# Patient Record
Sex: Male | Born: 1955 | Race: White | Hispanic: No | Marital: Married | Smoking: Former smoker
Health system: Southern US, Community
[De-identification: ages and names within clinical notes are randomized; demographics above are authoritative.]

## PROBLEM LIST (undated history)

## (undated) DIAGNOSIS — G3109 Other frontotemporal dementia: Secondary | ICD-10-CM

## (undated) DIAGNOSIS — F329 Major depressive disorder, single episode, unspecified: Secondary | ICD-10-CM

## (undated) DIAGNOSIS — F0280 Dementia in other diseases classified elsewhere without behavioral disturbance: Secondary | ICD-10-CM

## (undated) DIAGNOSIS — R413 Other amnesia: Secondary | ICD-10-CM

## (undated) DIAGNOSIS — I10 Essential (primary) hypertension: Secondary | ICD-10-CM

## (undated) DIAGNOSIS — G1220 Motor neuron disease, unspecified: Secondary | ICD-10-CM

## (undated) DIAGNOSIS — F32A Depression, unspecified: Secondary | ICD-10-CM

## (undated) DIAGNOSIS — R296 Repeated falls: Secondary | ICD-10-CM

## (undated) DIAGNOSIS — F419 Anxiety disorder, unspecified: Secondary | ICD-10-CM

## (undated) HISTORY — DX: Other amnesia: R41.3

## (undated) HISTORY — DX: Depression, unspecified: F32.A

## (undated) HISTORY — DX: Major depressive disorder, single episode, unspecified: F32.9

## (undated) HISTORY — DX: Repeated falls: R29.6

## (undated) HISTORY — DX: Essential (primary) hypertension: I10

## (undated) HISTORY — DX: Anxiety disorder, unspecified: F41.9

---

## 2014-04-10 ENCOUNTER — Ambulatory Visit: Payer: Self-pay | Admitting: Internal Medicine

## 2014-07-08 DIAGNOSIS — F1010 Alcohol abuse, uncomplicated: Secondary | ICD-10-CM | POA: Insufficient documentation

## 2015-05-01 ENCOUNTER — Other Ambulatory Visit: Payer: Self-pay | Admitting: Physician Assistant

## 2015-05-01 DIAGNOSIS — R269 Unspecified abnormalities of gait and mobility: Secondary | ICD-10-CM

## 2015-05-01 DIAGNOSIS — R413 Other amnesia: Secondary | ICD-10-CM

## 2015-05-07 ENCOUNTER — Ambulatory Visit: Payer: 59

## 2015-05-12 ENCOUNTER — Ambulatory Visit
Admission: RE | Admit: 2015-05-12 | Discharge: 2015-05-12 | Disposition: A | Discharge status: Home / Self Care | Payer: 59 | Source: Ambulatory Visit | Attending: Physician Assistant | Admitting: Physician Assistant

## 2015-05-12 DIAGNOSIS — R413 Other amnesia: Secondary | ICD-10-CM

## 2015-05-12 DIAGNOSIS — R269 Unspecified abnormalities of gait and mobility: Secondary | ICD-10-CM | POA: Insufficient documentation

## 2015-05-12 MED ORDER — GADOBENATE DIMEGLUMINE 529 MG/ML IV SOLN
20.0000 mL | Freq: Once | INTRAVENOUS | Status: AC | PRN
  Administered 2015-05-12: 17 mL via INTRAVENOUS

## 2015-11-18 ENCOUNTER — Encounter: Payer: Self-pay | Admitting: Psychiatry

## 2015-11-18 ENCOUNTER — Ambulatory Visit (INDEPENDENT_AMBULATORY_CARE_PROVIDER_SITE_OTHER): Payer: 59 | Admitting: Psychiatry

## 2015-11-18 VITALS — BP 122/86 | HR 71 | Temp 97.2°F | Ht 68.0 in | Wt 188.4 lb

## 2015-11-18 DIAGNOSIS — F331 Major depressive disorder, recurrent, moderate: Secondary | ICD-10-CM

## 2015-11-18 DIAGNOSIS — F10280 Alcohol dependence with alcohol-induced anxiety disorder: Secondary | ICD-10-CM | POA: Diagnosis not present

## 2015-11-18 MED ORDER — AMANTADINE HCL 100 MG PO TABS: 100.0000 mg | ORAL_TABLET | Freq: Every day | ORAL | Status: DC

## 2015-11-18 MED ORDER — PROPRANOLOL HCL ER 80 MG PO CP24: 80.0000 mg | ORAL_CAPSULE | Freq: Every day | ORAL | Status: DC
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
## 2015-12-19 ENCOUNTER — Other Ambulatory Visit: Payer: Self-pay

## 2015-12-19 ENCOUNTER — Ambulatory Visit (INDEPENDENT_AMBULATORY_CARE_PROVIDER_SITE_OTHER): Payer: 59 | Admitting: Psychiatry

## 2015-12-19 ENCOUNTER — Ambulatory Visit: Payer: 59 | Admitting: Psychiatry

## 2015-12-19 ENCOUNTER — Encounter: Payer: Self-pay | Admitting: Psychiatry

## 2015-12-19 VITALS — BP 124/82 | HR 69 | Temp 97.0°F | Ht 66.0 in | Wt 184.4 lb

## 2015-12-19 DIAGNOSIS — F331 Major depressive disorder, recurrent, moderate: Secondary | ICD-10-CM | POA: Diagnosis not present

## 2015-12-19 DIAGNOSIS — F10280 Alcohol dependence with alcohol-induced anxiety disorder: Secondary | ICD-10-CM

## 2015-12-19 MED ORDER — SERTRALINE HCL 100 MG PO TABS: 100.0000 mg | ORAL_TABLET | Freq: Every day | ORAL | Status: DC

## 2015-12-19 MED ORDER — BUSPIRONE HCL 15 MG PO TABS: 15.0000 mg | ORAL_TABLET | Freq: Two times a day (BID) | ORAL | Status: DC

## 2015-12-19 MED ORDER — PROPRANOLOL HCL ER 60 MG PO CP24: 60.0000 mg | ORAL_CAPSULE | Freq: Every day | ORAL | Status: DC

## 2015-12-19 MED ORDER — AMANTADINE HCL 100 MG PO TABS: 100.0000 mg | ORAL_TABLET | Freq: Two times a day (BID) | ORAL | Status: DC

## 2015-12-19 MED ORDER — VITAMIN B-1 100 MG PO TABS: 100.0000 mg | ORAL_TABLET | Freq: Every day | ORAL | Status: DC
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
## 2015-12-19 NOTE — Telephone Encounter (Signed)
received a request that patient insurance will only cover for a 90 day supply please resend new rx for 90 days

## 2015-12-22 ENCOUNTER — Telehealth: Payer: Self-pay

## 2015-12-22 MED ORDER — PROPRANOLOL HCL ER 60 MG PO CP24: 60.0000 mg | ORAL_CAPSULE | Freq: Every day | ORAL | Status: DC

## 2015-12-22 MED ORDER — BUSPIRONE HCL 15 MG PO TABS: 15.0000 mg | ORAL_TABLET | Freq: Two times a day (BID) | ORAL | Status: DC

## 2015-12-22 MED ORDER — SERTRALINE HCL 100 MG PO TABS: 100.0000 mg | ORAL_TABLET | Freq: Every day | ORAL | Status: DC

## 2015-12-22 NOTE — Telephone Encounter (Signed)
left message with pharmacy that direction are suppose to be take 1/2 tablet bid #[phone]

## 2016-01-16 ENCOUNTER — Encounter: Payer: Self-pay | Admitting: Psychiatry

## 2016-01-16 ENCOUNTER — Ambulatory Visit (INDEPENDENT_AMBULATORY_CARE_PROVIDER_SITE_OTHER): Payer: 59 | Admitting: Psychiatry

## 2016-01-16 VITALS — BP 122/88 | HR 69 | Temp 98.1°F | Ht 66.0 in | Wt 186.0 lb

## 2016-01-16 DIAGNOSIS — F331 Major depressive disorder, recurrent, moderate: Secondary | ICD-10-CM | POA: Diagnosis not present

## 2016-01-16 DIAGNOSIS — F10280 Alcohol dependence with alcohol-induced anxiety disorder: Secondary | ICD-10-CM

## 2016-01-16 MED ORDER — SERTRALINE HCL 100 MG PO TABS: 100.0000 mg | ORAL_TABLET | Freq: Every day | ORAL | Status: DC

## 2016-01-16 MED ORDER — PROPRANOLOL HCL 40 MG PO TABS: 40.0000 mg | ORAL_TABLET | Freq: Every morning | ORAL | Status: DC
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
## 2016-01-22 ENCOUNTER — Other Ambulatory Visit: Payer: Self-pay | Admitting: Physician Assistant

## 2016-01-22 ENCOUNTER — Ambulatory Visit
Admission: RE | Admit: 2016-01-22 | Discharge: 2016-01-22 | Disposition: A | Discharge status: Home / Self Care | Payer: 59 | Source: Ambulatory Visit | Attending: Physician Assistant | Admitting: Physician Assistant

## 2016-01-22 DIAGNOSIS — M79641 Pain in right hand: Secondary | ICD-10-CM

## 2016-01-22 DIAGNOSIS — M19031 Primary osteoarthritis, right wrist: Secondary | ICD-10-CM | POA: Diagnosis not present

## 2016-01-22 DIAGNOSIS — M25532 Pain in left wrist: Secondary | ICD-10-CM | POA: Diagnosis not present

## 2016-01-22 DIAGNOSIS — R52 Pain, unspecified: Secondary | ICD-10-CM

## 2016-02-11 ENCOUNTER — Other Ambulatory Visit: Payer: Self-pay | Admitting: Psychiatry

## 2016-03-08 ENCOUNTER — Ambulatory Visit: Payer: 59 | Admitting: Neurology

## 2016-03-08 ENCOUNTER — Telehealth: Payer: Self-pay | Admitting: *Deleted

## 2016-03-08 NOTE — Telephone Encounter (Signed)
No showed new patient appointment. 

## 2016-03-09 ENCOUNTER — Encounter: Payer: Self-pay | Admitting: Neurology

## 2016-03-17 ENCOUNTER — Ambulatory Visit: Payer: Self-pay | Admitting: Psychiatry

## 2016-04-27 ENCOUNTER — Other Ambulatory Visit: Payer: Self-pay | Admitting: Internal Medicine

## 2016-04-27 DIAGNOSIS — R202 Paresthesia of skin: Secondary | ICD-10-CM

## 2016-04-28 ENCOUNTER — Other Ambulatory Visit: Payer: Self-pay | Admitting: Psychiatry

## 2016-05-05 ENCOUNTER — Ambulatory Visit (INDEPENDENT_AMBULATORY_CARE_PROVIDER_SITE_OTHER): Payer: 59 | Admitting: Neurology

## 2016-05-05 ENCOUNTER — Encounter: Payer: Self-pay | Admitting: Neurology

## 2016-05-05 ENCOUNTER — Telehealth: Payer: Self-pay | Admitting: Neurology

## 2016-05-05 VITALS — BP 135/82 | HR 100 | Ht 66.0 in | Wt 184.8 lb

## 2016-05-05 DIAGNOSIS — R259 Unspecified abnormal involuntary movements: Secondary | ICD-10-CM

## 2016-05-05 DIAGNOSIS — R269 Unspecified abnormalities of gait and mobility: Secondary | ICD-10-CM | POA: Diagnosis not present

## 2016-05-05 DIAGNOSIS — R410 Disorientation, unspecified: Secondary | ICD-10-CM | POA: Diagnosis not present

## 2016-05-05 DIAGNOSIS — R413 Other amnesia: Secondary | ICD-10-CM | POA: Diagnosis not present

## 2016-05-05 MED ORDER — RASAGILINE MESYLATE 1 MG PO TABS: 1.0000 mg | ORAL_TABLET | Freq: Every day | ORAL | 11 refills | Status: DC
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
## 2016-05-05 NOTE — Telephone Encounter (Signed)
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  The family is already aware of the med change.  Called pharmacy and relayed message to [name], who voided the med on her end.

## 2016-05-05 NOTE — Telephone Encounter (Signed)
[facility] [phone] called to advise there is a drug interaction between azilect and zoloft a level 1 severity. She said the other provider has had the pt on the zoloft for awhile and it is saying there should be a "wash out time" before stopping it and starting azilcet. Please call

## 2016-05-06 LAB — THYROID ANTIBODIES
Thyroglobulin Antibody: <1 IU/mL (ref 0.0–0.9)
Thyroperoxidase Ab SerPl-aCnc: 43 IU/mL — ABNORMAL HIGH (ref 0–34)

## 2016-05-07 ENCOUNTER — Telehealth: Payer: Self-pay | Admitting: Neurology

## 2016-05-07 NOTE — Telephone Encounter (Signed)
I was able to talk with his primary care physician Dr. Welton FlakesKhan about the diagnosis of possible central nervous system degenerative disorder, workup plans, We are in agreement with the plan.    Please let patient's family know, I have talked with his primary care Dr.Khan,   lab showed mildly elevated thyroidperoxidase antibody of unknown clinical significance.

## 2016-05-10 NOTE — Telephone Encounter (Signed)
Left message w/ results and to let them know Dr. Terrace ArabiaYan has spoken w/ his PCP.

## 2016-05-12 ENCOUNTER — Ambulatory Visit
Admission: RE | Admit: 2016-05-12 | Discharge: 2016-05-12 | Disposition: A | Discharge status: Home / Self Care | Payer: 59 | Source: Ambulatory Visit | Attending: Internal Medicine | Admitting: Internal Medicine

## 2016-05-12 DIAGNOSIS — M5030 Other cervical disc degeneration, unspecified cervical region: Secondary | ICD-10-CM | POA: Insufficient documentation

## 2016-05-12 DIAGNOSIS — M50221 Other cervical disc displacement at C4-C5 level: Secondary | ICD-10-CM | POA: Diagnosis not present

## 2016-05-12 DIAGNOSIS — R202 Paresthesia of skin: Secondary | ICD-10-CM | POA: Insufficient documentation

## 2016-05-15 ENCOUNTER — Other Ambulatory Visit: Payer: Self-pay | Admitting: Psychiatry

## 2016-05-18 NOTE — Addendum Note (Signed)
Addended by: Levert FeinsteinYAN, Marlon Suleiman on: 05/18/2016 02:09 PM   Modules accepted: Orders

## 2016-05-19 ENCOUNTER — Ambulatory Visit (INDEPENDENT_AMBULATORY_CARE_PROVIDER_SITE_OTHER): Payer: 59

## 2016-05-19 DIAGNOSIS — R269 Unspecified abnormalities of gait and mobility: Secondary | ICD-10-CM

## 2016-05-19 DIAGNOSIS — R413 Other amnesia: Secondary | ICD-10-CM | POA: Diagnosis not present

## 2016-05-19 DIAGNOSIS — R259 Unspecified abnormal involuntary movements: Secondary | ICD-10-CM | POA: Diagnosis not present

## 2016-05-19 DIAGNOSIS — R410 Disorientation, unspecified: Secondary | ICD-10-CM | POA: Diagnosis not present

## 2016-05-21 ENCOUNTER — Telehealth: Payer: Self-pay | Admitting: Neurology

## 2016-05-21 NOTE — Telephone Encounter (Signed)
Please call patient, MRI of the brain showed evidence of atrophy especially at the left perisylvian temporal region. Will review MRI of brain together at their next follow-up visit.   Mildly abnormal MRI brain (without) demonstrating: 1. Mild right and moderate left perisylvian / superior temporal atrophy.  2. Mild periventricular gliosis. 3. No acute findings. 4. Compared to MRI on 05/12/15, the left perisylvian atrophy is more prominent in the current study. This is non-specific but can be seen in association with neurodegenerative conditions such as primary progressive aphasia or fronto-temporal dementia.

## 2016-05-21 NOTE — Telephone Encounter (Signed)
Pt aware of results and would like to come in earlier to discuss.  He is appt has been moved to 05/27/16.  His EEG is still pending for 06/17/16.

## 2016-05-27 ENCOUNTER — Ambulatory Visit (INDEPENDENT_AMBULATORY_CARE_PROVIDER_SITE_OTHER): Payer: 59 | Admitting: Neurology

## 2016-05-27 ENCOUNTER — Encounter: Payer: Self-pay | Admitting: Neurology

## 2016-05-27 VITALS — BP 149/94 | HR 110 | Ht 66.0 in | Wt 183.5 lb

## 2016-05-27 DIAGNOSIS — R259 Unspecified abnormal involuntary movements: Secondary | ICD-10-CM | POA: Diagnosis not present

## 2016-05-27 DIAGNOSIS — R269 Unspecified abnormalities of gait and mobility: Secondary | ICD-10-CM

## 2016-05-27 MED ORDER — MEMANTINE HCL 10 MG PO TABS: 10.0000 mg | ORAL_TABLET | Freq: Two times a day (BID) | ORAL | 11 refills | Status: DC
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
## 2016-06-15 ENCOUNTER — Other Ambulatory Visit: Payer: 59

## 2016-06-17 ENCOUNTER — Ambulatory Visit (INDEPENDENT_AMBULATORY_CARE_PROVIDER_SITE_OTHER): Payer: 59 | Admitting: Neurology

## 2016-06-17 DIAGNOSIS — R413 Other amnesia: Secondary | ICD-10-CM

## 2016-06-17 DIAGNOSIS — R269 Unspecified abnormalities of gait and mobility: Secondary | ICD-10-CM

## 2016-06-17 DIAGNOSIS — R410 Disorientation, unspecified: Secondary | ICD-10-CM | POA: Diagnosis not present

## 2016-06-21 ENCOUNTER — Other Ambulatory Visit: Payer: Self-pay | Admitting: Psychiatry

## 2016-06-21 NOTE — Procedures (Signed)
   HISTORY: 60 years old male presented with progressive worsening memory loss, loss of right-sided motor dextrality.  TECHNIQUE:  16 channel EEG was performed based on standard 10-16 international system. One channel was dedicated to EKG, which has demonstrates normal sinus rhythm of 84 beats per minutes.  Upon awakening, the posterior background activity was well-developed, in alpha range, 11 Hz, reactive to eye opening and closure. There was frequent muscle artifact.   There was no evidence of epileptiform discharge.  Photic stimulation was performed, which induced a symmetric photic driving.  Hyperventilation was performed, there was no abnormality elicit.  No sleep was achieved.  CONCLUSION: This is a  normal  awake EEG.  There is no electrodiagnostic evidence of epileptiform discharge.  Levert FeinsteinYijun Vora Clover, M.D. Ph.D.  [facility] [address] [place], [place] [address] Phone: [phone] Fax:      [phone]

## 2016-06-23 ENCOUNTER — Telehealth: Payer: Self-pay | Admitting: Neurology

## 2016-06-23 NOTE — Telephone Encounter (Addendum)
Dr. Terrace ArabiaYan is aware of his right-arm weakness and has evaluated this in the past.  It is part of his condition and he does not need to come in for further evaluation of the problem.  Returned call to patient and he confirmed this was the same condition - not a new symptom.

## 2016-06-23 NOTE — Telephone Encounter (Signed)
Beth/Dr Park BreedKahn [phone] called to schedule pt appt. She said the pt is having rt arm weakness. She did not know the onset. This is a new symptom. Appt was scheduled for 11/14

## 2016-07-05 ENCOUNTER — Ambulatory Visit: Payer: 59 | Admitting: Neurology

## 2016-07-13 ENCOUNTER — Ambulatory Visit (INDEPENDENT_AMBULATORY_CARE_PROVIDER_SITE_OTHER): Payer: 59 | Admitting: Neurology

## 2016-07-13 ENCOUNTER — Other Ambulatory Visit: Payer: Self-pay | Admitting: *Deleted

## 2016-07-13 ENCOUNTER — Encounter: Payer: Self-pay | Admitting: Neurology

## 2016-07-13 VITALS — Ht 66.0 in | Wt 183.8 lb

## 2016-07-13 DIAGNOSIS — F0390 Unspecified dementia without behavioral disturbance: Secondary | ICD-10-CM | POA: Insufficient documentation

## 2016-07-13 DIAGNOSIS — F0281 Dementia in other diseases classified elsewhere with behavioral disturbance: Secondary | ICD-10-CM | POA: Diagnosis not present

## 2016-07-13 DIAGNOSIS — F482 Pseudobulbar affect: Secondary | ICD-10-CM | POA: Insufficient documentation

## 2016-07-13 DIAGNOSIS — G3109 Other frontotemporal dementia: Secondary | ICD-10-CM

## 2016-07-13 MED ORDER — DEXTROMETHORPHAN-QUINIDINE 20-10 MG PO CAPS: ORAL_CAPSULE | ORAL | 11 refills | Status: DC

## 2016-07-13 NOTE — Patient Instructions (Signed)
Stop Namenda 1 tablet a day for 1 week, then stop.
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
## 2016-07-14 ENCOUNTER — Telehealth: Payer: Self-pay | Admitting: *Deleted

## 2016-07-14 NOTE — Telephone Encounter (Signed)
PA approved by [employer] [phone]) through 10/14/16 - JW#[id number] - member NF#[id number].  Pharmacy notified and will fill for patient.

## 2016-09-02 ENCOUNTER — Ambulatory Visit: Payer: 59 | Admitting: Neurology

## 2016-09-20 ENCOUNTER — Telehealth: Payer: Self-pay | Admitting: *Deleted

## 2016-09-20 NOTE — Telephone Encounter (Signed)
Patient's wife is requiring FMLA to be completed for her employer (she is the primary caregiver).  I need to discuss the paperwork with her.  Left message for a return call.

## 2016-09-21 NOTE — Telephone Encounter (Signed)
Attempted to return call; left a message.

## 2016-09-21 NOTE — Telephone Encounter (Signed)
Patient's wife is returning your call. She can be reached at [phone].

## 2016-09-21 NOTE — Telephone Encounter (Signed)
Spoke to TriumphLisa - states patient is doing well and does not really need additional care at home at this time.  She would like to hold off on completing FMLA paperwork at this time.

## 2016-10-20 ENCOUNTER — Telehealth: Payer: Self-pay | Admitting: *Deleted

## 2016-10-20 NOTE — Telephone Encounter (Signed)
Nuedexta PA approved by [employer] ([phone]) through 10/21/17 - JW#[id number] - pt NF#[id number].

## 2016-11-22 ENCOUNTER — Other Ambulatory Visit: Payer: Self-pay | Admitting: *Deleted

## 2016-11-22 MED ORDER — DEXTROMETHORPHAN-QUINIDINE 20-10 MG PO CAPS: ORAL_CAPSULE | ORAL | 3 refills | Status: DC

## 2016-12-11 ENCOUNTER — Emergency Department: Payer: 59

## 2016-12-11 ENCOUNTER — Encounter: Payer: Self-pay | Admitting: Emergency Medicine

## 2016-12-11 ENCOUNTER — Emergency Department
Admission: EM | Admit: 2016-12-11 | Discharge: 2016-12-11 | Disposition: A | Discharge status: Home / Self Care | Payer: 59 | Attending: Emergency Medicine | Admitting: Emergency Medicine

## 2016-12-11 DIAGNOSIS — Z79899 Other long term (current) drug therapy: Secondary | ICD-10-CM | POA: Insufficient documentation

## 2016-12-11 DIAGNOSIS — S20211A Contusion of right front wall of thorax, initial encounter: Secondary | ICD-10-CM | POA: Insufficient documentation

## 2016-12-11 DIAGNOSIS — Z87891 Personal history of nicotine dependence: Secondary | ICD-10-CM | POA: Diagnosis not present

## 2016-12-11 DIAGNOSIS — Y9389 Activity, other specified: Secondary | ICD-10-CM | POA: Diagnosis not present

## 2016-12-11 DIAGNOSIS — Y92002 Bathroom of unspecified non-institutional (private) residence single-family (private) house as the place of occurrence of the external cause: Secondary | ICD-10-CM | POA: Diagnosis not present

## 2016-12-11 DIAGNOSIS — S299XXA Unspecified injury of thorax, initial encounter: Secondary | ICD-10-CM | POA: Diagnosis present

## 2016-12-11 DIAGNOSIS — I10 Essential (primary) hypertension: Secondary | ICD-10-CM | POA: Diagnosis not present

## 2016-12-11 DIAGNOSIS — Y999 Unspecified external cause status: Secondary | ICD-10-CM | POA: Diagnosis not present

## 2016-12-11 DIAGNOSIS — W182XXA Fall in (into) shower or empty bathtub, initial encounter: Secondary | ICD-10-CM | POA: Insufficient documentation

## 2016-12-11 MED ORDER — PREDNISONE 10 MG PO TABS: 10.0000 mg | ORAL_TABLET | Freq: Every day | ORAL | 0 refills | Status: DC

## 2016-12-11 NOTE — ED Notes (Signed)
Chest xray results received. Patient moved to flex waiting room.

## 2016-12-11 NOTE — ED Provider Notes (Signed)
[facility] Emergency Department Provider Note  ____________________________________________  Time seen: Approximately 4:39 PM  I have reviewed the triage vital signs and the nursing notes.   HISTORY  Chief Complaint Shortness of Breath    HPI Fred Roman is a 61 y.o. male who presents to emergency department complaining of right rib pain. Patient reports that he was in the shower and slipped and fell landing against his right rib cage. Patient denies any visible swelling or abnormality to his rib cage. Patient has had continued pain for the past week. He reports that there is pain with inspiration or twisting movements. He denies any frank shortness of breath. No cough. No complaints at this time. Patient did not hit his head or lose consciousness during fall.   Past Medical History:  Diagnosis Date  . Anxiety   . Depression   . Frequent falls   . Hypertension   . Memory loss     Patient Active Problem List   Diagnosis Date Noted  . Dementia 07/13/2016  . 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 Memory loss 05/05/2016  . Abnormality of gait 05/05/2016  . Confusion 05/05/2016  . 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 AA (alcohol abuse) 07/08/2014    History reviewed. No pertinent surgical history.  Prior to Admission medications   Medication Sig Start Date End Date Taking? Authorizing Provider  Amantadine HCl 100 MG tablet Take 1 tablet (100 mg total) by mouth 2 (two) times daily. 12/19/15   Brandy Hale, MD  amLODipine (NORVASC) 5 MG tablet Take 5 mg by mouth daily. 05/04/16   Historical Provider, MD  busPIRone (BUSPAR) 15 MG tablet TAKE 1/2 TABLET BY MOUTH TWICE A DAY 02/12/16   Brandy Hale, MD  Dextromethorphan-Quinidine (NUEDEXTA) 20-10 MG CAPS One tab twice a day. 11/22/16   Levert Feinstein, MD  Folic Acid 20 MG CAPS Take 960 mcg by mouth 2 (two) times daily.    Historical Provider, MD  hydrochlorothiazide (MICROZIDE) 12.5 MG capsule Take 12.5 mg by  mouth daily. 09/20/15   Historical Provider, MD  lovastatin (MEVACOR) 10 MG tablet Take 10 mg by mouth daily. 05/29/15   Historical Provider, MD  memantine (NAMENDA) 10 MG tablet Take 1 tablet (10 mg total) by mouth 2 (two) times daily. 05/27/16   Levert Feinstein, MD  sertraline (ZOLOFT) 100 MG tablet Take 1 tablet (100 mg total) by mouth daily. 01/16/16   Brandy Hale, MD  thiamine (VITAMIN B-1) 100 MG tablet Take 1 tablet (100 mg total) by mouth daily. 12/19/15   Brandy Hale, MD    Allergies Patient has no known allergies.  Family History  Problem Relation Age of Onset  . Stroke Mother   . Dementia Mother   . Heart disease Mother   . Diabetes Mother   . Aneurysm Mother   . Ovarian cancer Mother   . Alcohol abuse Father   . Depression Father   . Brain cancer Father   . Alcohol abuse Brother   . ALS Maternal Grandmother   . Prostate cancer Maternal Grandfather     Social History Social History  Substance Use Topics  . Smoking status: Former Smoker    Quit date: 11/17/2004  . Smokeless tobacco: Never Used  . Alcohol use 0.0 oz/week     Comment: Seldom use     Review of Systems  Constitutional: No fever/chills Eyes: No visual changes.  ENT: No upper respiratory complaints. Cardiovascular: no chest pain. Respiratory: no cough. No SOB. Gastrointestinal: No abdominal  pain.  No nausea, no vomiting.  Musculoskeletal: Positive for left rib pain Skin: Negative for rash, abrasions, lacerations, ecchymosis. Neurological: Negative for headaches, focal weakness or numbness. 10-point ROS otherwise negative.  ____________________________________________   PHYSICAL EXAM:  VITAL SIGNS: ED Triage Vitals  Enc Vitals Group     BP 12/11/16 1412 (!) 143/74     Pulse Rate 12/11/16 1412 81     Resp 12/11/16 1412 16     Temp 12/11/16 1412 98.6 F (37 C)     Temp Source 12/11/16 1412 Oral     SpO2 12/11/16 1412 96 %     Weight 12/11/16 1413 200 lb (90.7 kg)     Height 12/11/16 1413   (1.676 m)     Head Circumference --      Peak Flow --      Pain Score 12/11/16 1412 9     Pain Loc --      Pain Edu? --      Excl. in GC? --      Constitutional: Alert and oriented. Well appearing and in no acute distress. Eyes: Conjunctivae are normal. PERRL. EOMI. Head: Atraumatic. Neck: No stridor.    Cardiovascular: Normal rate, regular rhythm. Normal S1 and S2.  Good peripheral circulation. Respiratory: Normal respiratory effort without tachypnea or retractions. Lungs CTAB. Good air entry to the bases with no decreased or absent breath sounds. Gastrointestinal: Bowel sounds 4 quadrants. Soft and nontender to palpation. No guarding or rigidity. No palpable masses. No distention Musculoskeletal: Full range of motion to all extremities. No gross deformities appreciated. No visible deformity strips by inspection. No paradoxical Chest wall movement. Patient is tender to palpation over the seventh through 11th ribs on right side. No palpable abnormality. No crepitus. No subcutaneous emphysema. Good underlying breath sounds bilaterally. Neurologic:  Normal speech and language. No gross focal neurologic deficits are appreciated.  Skin:  Skin is warm, dry and intact. No rash noted. Psychiatric: Mood and affect are normal. Speech and behavior are normal. Patient exhibits appropriate insight and judgement.   ____________________________________________   LABS (all labs ordered are listed, but only abnormal results are displayed)  Labs Reviewed - No data to display ____________________________________________  EKG   ____________________________________________  RADIOLOGY Festus Barren Cuthriell, personally viewed and evaluated these images (plain radiographs) as part of my medical decision making, as well as reviewing the written report by the radiologist.  Dg Chest 2 View  Result Date: 12/11/2016 CLINICAL DATA:  61 year old male right side pain after fall in bathtub a week ago.  Inspiratory pain. EXAM: CHEST  2 VIEW COMPARISON:  None. FINDINGS: Somewhat low lung volumes. Cardiac size at the upper limits of normal. Other mediastinal contours are within normal limits. Visualized tracheal air column is within normal limits. No pneumothorax or pulmonary edema. No pleural effusion or confluent pulmonary opacity. No displaced right rib fracture identified. Other visible osseous structures appear intact. Negative visible bowel gas pattern. IMPRESSION: Low lung volumes, otherwise no acute cardiopulmonary abnormality. Electronically Signed   By: Odessa Fleming M.D.   On: 12/11/2016 15:29    ____________________________________________    PROCEDURES  Procedure(s) performed:    Procedures    Medications - No data to display   ____________________________________________   INITIAL IMPRESSION / ASSESSMENT AND PLAN / ED COURSE  Pertinent labs & imaging results that were available during my care of the patient were reviewed by me and considered in my medical decision making (see chart for details).  Review of  the [place] CSRS was performed in accordance of the [facility] prior to dispensing any controlled drugs.     Patient's diagnosis is consistent with right rib contusion. X-ray was negative for acute cardiopulmonary or osseous abnormality. Exam is reassuring. No further imaging or labs are deemed necessary at this time.. Patient will be discharged home with prescriptions for prednisone for inflammatory control. Patient is to follow up with primary care as needed or otherwise directed. Patient is given ED precautions to return to the ED for any worsening or new symptoms.     ____________________________________________  FINAL CLINICAL IMPRESSION(S) / ED DIAGNOSES  Final diagnoses:  None      NEW MEDICATIONS STARTED DURING THIS VISIT:  New Prescriptions   No medications on file        This chart was dictated using voice recognition software/Dragon. Despite best  efforts to proofread, errors can occur which can change the meaning. Any change was purely unintentional.    Racheal Patches, PA-C 12/11/16 1647    Rockne Menghini, MD 12/11/16 1750

## 2016-12-11 NOTE — ED Notes (Signed)

## 2016-12-11 NOTE — ED Triage Notes (Signed)
Pt to ed with c/o right side rib pain post fall over a week ago in the shower.  Pt reports pain with inspiration, pain with movement.

## 2017-01-13 ENCOUNTER — Ambulatory Visit: Payer: 59 | Admitting: Neurology

## 2017-03-30 ENCOUNTER — Telehealth: Payer: Self-pay | Admitting: *Deleted

## 2017-03-30 NOTE — Telephone Encounter (Signed)
Pt MRI Brain @ front desk for pick up.

## 2017-04-24 ENCOUNTER — Emergency Department: Payer: 59

## 2017-04-24 ENCOUNTER — Emergency Department
Admission: EM | Admit: 2017-04-24 | Discharge: 2017-04-24 | Disposition: A | Discharge status: Home / Self Care | Payer: 59 | Attending: Emergency Medicine | Admitting: Emergency Medicine

## 2017-04-24 ENCOUNTER — Encounter: Payer: Self-pay | Admitting: Emergency Medicine

## 2017-04-24 DIAGNOSIS — F0390 Unspecified dementia without behavioral disturbance: Secondary | ICD-10-CM | POA: Insufficient documentation

## 2017-04-24 DIAGNOSIS — Z87891 Personal history of nicotine dependence: Secondary | ICD-10-CM | POA: Diagnosis not present

## 2017-04-24 DIAGNOSIS — Z79899 Other long term (current) drug therapy: Secondary | ICD-10-CM | POA: Diagnosis not present

## 2017-04-24 DIAGNOSIS — I10 Essential (primary) hypertension: Secondary | ICD-10-CM | POA: Diagnosis not present

## 2017-04-24 DIAGNOSIS — W19XXXA Unspecified fall, initial encounter: Secondary | ICD-10-CM

## 2017-04-24 DIAGNOSIS — M25511 Pain in right shoulder: Secondary | ICD-10-CM | POA: Diagnosis present

## 2017-04-24 HISTORY — DX: Dementia in other diseases classified elsewhere, unspecified severity, without behavioral disturbance, psychotic disturbance, mood disturbance, and anxiety: F02.80

## 2017-04-24 HISTORY — DX: Motor neuron disease, unspecified: G12.20

## 2017-04-24 HISTORY — DX: Other frontotemporal dementia: G31.09

## 2017-04-24 NOTE — ED Triage Notes (Signed)
Pt to ED via POV for fall. Pt wife states that pt has FTD and he has frequent falls due to this. Today pt went outside and was doing something on their boat, when pt was trying to get off the boat he mistepped and fell hitting his head. Pt denies LOC and is not on blood thinners. Per wife pt at baseline. Pt denies any pain at this time.

## 2017-04-24 NOTE — ED Provider Notes (Signed)
[facility] Emergency Department Provider Note   ____________________________________________   I have reviewed the triage vital signs and the nursing notes.   HISTORY  Chief Complaint Fall   History limited by: Dementia, some history obtained from family   HPI Fred Roman is a 61 y.o. male who presents to the emergency department today after a fall. Apparently the patient has a pontoon boat in his yard that he fell off of. He has a history of falls. He states that he landed on his right shoulder and hit his head. Since that time he has had pain in his right shoulder. He denies any loss of consciousness. Denies any headache. No nausea or vomiting. No change in vision. No change in behavior per family. No blood thinner or aspirin use.     Past Medical History:  Diagnosis Date  . Anxiety   . Depression   . Frequent falls   . 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 Hypertension   . Memory loss     Patient Active Problem List   Diagnosis Date Noted  . Dementia 07/13/2016  . 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 Memory loss 05/05/2016  . Abnormality of gait 05/05/2016  . Confusion 05/05/2016  . 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 AA (alcohol abuse) 07/08/2014    History reviewed. No pertinent surgical history.  Prior to Admission medications   Medication Sig Start Date End Date Taking? Authorizing Provider  Amantadine HCl 100 MG tablet Take 1 tablet (100 mg total) by mouth 2 (two) times daily. 12/19/15   Brandy Hale, MD  amLODipine (NORVASC) 5 MG tablet Take 5 mg by mouth daily. 05/04/16   [provider]  busPIRone (BUSPAR) 15 MG tablet TAKE 1/2 TABLET BY MOUTH TWICE A DAY 02/12/16   Brandy Hale, MD  Dextromethorphan-Quinidine (NUEDEXTA) 20-10 MG CAPS One tab twice a day. 11/22/16   Levert Feinstein, MD  Folic Acid 20 MG CAPS Take 270 mcg by mouth 2 (two) times daily.    [provider]   hydrochlorothiazide (MICROZIDE) 12.5 MG capsule Take 12.5 mg by mouth daily. 09/20/15   [provider]  lovastatin (MEVACOR) 10 MG tablet Take 10 mg by mouth daily. 05/29/15   [provider]  memantine (NAMENDA) 10 MG tablet Take 1 tablet (10 mg total) by mouth 2 (two) times daily. 05/27/16   Levert Feinstein, MD  predniSONE (DELTASONE) 10 MG tablet Take 1 tablet (10 mg total) by mouth daily. 12/11/16   Cuthriell, Delorise Royals, PA-C  sertraline (ZOLOFT) 100 MG tablet Take 1 tablet (100 mg total) by mouth daily. 01/16/16   Brandy Hale, MD  thiamine (VITAMIN B-1) 100 MG tablet Take 1 tablet (100 mg total) by mouth daily. 12/19/15   Brandy Hale, MD    Allergies Patient has no known allergies.  Family History  Problem Relation Age of Onset  . Stroke Mother   . Dementia Mother   . Heart disease Mother   . Diabetes Mother   . Aneurysm Mother   . Ovarian cancer Mother   . Alcohol abuse Father   . Depression Father   . Brain cancer Father   . Alcohol abuse Brother   . ALS Maternal Grandmother   . Prostate cancer Maternal Grandfather     Social History Social History  Substance Use Topics  . Smoking status: Former Smoker    Quit date: 11/17/2004  . Smokeless tobacco: Never Used  . Alcohol use No  Comment: Seldom use    Review of Systems Constitutional: No fever/chills Eyes: No visual changes. ENT: No sore throat. Cardiovascular: Denies chest pain. Respiratory: Denies shortness of breath. Gastrointestinal: No abdominal pain.  No nausea, no vomiting.  No diarrhea.   Genitourinary: Negative for dysuria. Musculoskeletal: Positive for right shoulder pain Skin: Negative for rash. Neurological: Negative for headaches, focal weakness or numbness.  ____________________________________________   PHYSICAL EXAM:  VITAL SIGNS: ED Triage Vitals [04/24/17 1609]  Enc Vitals Group     BP (!) 143/77     Pulse      Resp 16     Temp (!) 97.5 F (36.4 C)     Temp Source  Oral     SpO2 97 %     Weight      Height      Head Circumference      Peak Flow      Pain Score      Pain Loc      Pain Edu?      Excl. in GC?      Constitutional: Alert and oriented. Well appearing and in no distress. Eyes: Conjunctivae are normal.  ENT   Head: Normocephalic and atraumatic.   Nose: No congestion/rhinnorhea.   Mouth/Throat: Mucous membranes are moist.   Neck: No stridor. No midline tenderness.  Hematological/Lymphatic/Immunilogical: No cervical lymphadenopathy. Cardiovascular: Normal rate, regular rhythm.  No murmurs, rubs, or gallops.  Respiratory: Normal respiratory effort without tachypnea nor retractions. Breath sounds are clear and equal bilaterally. No wheezes/rales/rhonchi. Gastrointestinal: Soft and non tender. No rebound. No guarding.  Genitourinary: Deferred Musculoskeletal: Right shoulder without any deformity. Some tenderness to palpation. Extremities otherwise wnl. No spinal tenderness.  Neurologic:  Normal speech and language. No gross focal neurologic deficits are appreciated.  Skin:  Skin is warm, dry and intact. No rash noted. Psychiatric: Mood and affect are normal. Speech and behavior are normal. Patient exhibits appropriate insight and judgment.  ____________________________________________    LABS (pertinent positives/negatives)  None  ____________________________________________   EKG  None  ____________________________________________    RADIOLOGY  Right shoulder IMPRESSION: No acute abnormality noted.   ____________________________________________   PROCEDURES  Procedures  ____________________________________________   INITIAL IMPRESSION / ASSESSMENT AND PLAN / ED COURSE  Pertinent labs & imaging results that were available during my care of the patient were reviewed by me and considered in my medical decision making (see chart for details).  Patient presents after a fall. Shoulder x-ray  negative. Had a discussion with patient and family about head ct given head trauma. At this point think patient is low risk for clinically significant intracranial process. Discussed with patient and family head ct. At this point they feel comfortable deferring which I think is reasonable. Did discuss return precautions.   ____________________________________________   FINAL CLINICAL IMPRESSION(S) / ED DIAGNOSES  Final diagnoses:  Fall, initial encounter  Acute pain of right shoulder     Note: This dictation was prepared with Dragon dictation. Any transcriptional errors that result from this process are unintentional     Phineas Semen, MD 04/24/17 [phone]

## 2017-04-24 NOTE — Discharge Instructions (Signed)
Please seek medical attention for any high fevers, chest pain, shortness of breath, change in behavior, persistent vomiting, bloody stool or any other new or concerning symptoms.  

## 2017-04-24 NOTE — ED Notes (Signed)
This RN went back out and spoke with pt and pt wife after Gearldine Bienenstock RN informed this nurse that when pt came in her was initially c/o right shoulder pain. Pt denied pain on initial triage assessment but when questioned again, pt states that he is having pain in the right shoulder.

## 2017-06-16 ENCOUNTER — Ambulatory Visit
Admission: RE | Admit: 2017-06-16 | Discharge: 2017-06-16 | Disposition: A | Discharge status: Home / Self Care | Payer: 59 | Source: Ambulatory Visit | Attending: Nurse Practitioner | Admitting: Nurse Practitioner

## 2017-06-16 ENCOUNTER — Other Ambulatory Visit: Payer: Self-pay | Admitting: Nurse Practitioner

## 2017-06-16 DIAGNOSIS — M25511 Pain in right shoulder: Secondary | ICD-10-CM

## 2017-09-12 ENCOUNTER — Other Ambulatory Visit: Payer: Self-pay | Admitting: Internal Medicine

## 2017-09-27 ENCOUNTER — Other Ambulatory Visit: Payer: Self-pay | Admitting: Internal Medicine

## 2017-10-02 ENCOUNTER — Other Ambulatory Visit: Payer: Self-pay | Admitting: Internal Medicine

## 2017-10-17 ENCOUNTER — Telehealth: Payer: Self-pay | Admitting: Internal Medicine

## 2017-10-17 NOTE — Telephone Encounter (Signed)
SIGNED PLAN OF CARE PUT INTO WELL CARE FOLDER TO BE PICKED UP.JW °

## 2017-11-25 ENCOUNTER — Emergency Department
Admission: EM | Admit: 2017-11-25 | Discharge: 2017-11-25 | Disposition: A | Discharge status: Home / Self Care | Payer: BLUE CROSS/BLUE SHIELD | Attending: Emergency Medicine | Admitting: Emergency Medicine

## 2017-11-25 ENCOUNTER — Encounter: Payer: Self-pay | Admitting: Emergency Medicine

## 2017-11-25 ENCOUNTER — Emergency Department: Payer: BLUE CROSS/BLUE SHIELD

## 2017-11-25 ENCOUNTER — Other Ambulatory Visit: Payer: Self-pay

## 2017-11-25 DIAGNOSIS — G20 Parkinson's disease: Secondary | ICD-10-CM | POA: Diagnosis not present

## 2017-11-25 DIAGNOSIS — Z79899 Other long term (current) drug therapy: Secondary | ICD-10-CM | POA: Diagnosis not present

## 2017-11-25 DIAGNOSIS — S0181XA Laceration without foreign body of other part of head, initial encounter: Secondary | ICD-10-CM | POA: Diagnosis present

## 2017-11-25 DIAGNOSIS — Y939 Activity, unspecified: Secondary | ICD-10-CM | POA: Diagnosis not present

## 2017-11-25 DIAGNOSIS — W19XXXA Unspecified fall, initial encounter: Secondary | ICD-10-CM | POA: Diagnosis not present

## 2017-11-25 DIAGNOSIS — Y999 Unspecified external cause status: Secondary | ICD-10-CM | POA: Diagnosis not present

## 2017-11-25 DIAGNOSIS — Z87891 Personal history of nicotine dependence: Secondary | ICD-10-CM | POA: Diagnosis not present

## 2017-11-25 DIAGNOSIS — Y92002 Bathroom of unspecified non-institutional (private) residence single-family (private) house as the place of occurrence of the external cause: Secondary | ICD-10-CM | POA: Diagnosis not present

## 2017-11-25 DIAGNOSIS — I10 Essential (primary) hypertension: Secondary | ICD-10-CM | POA: Diagnosis not present

## 2017-11-25 MED ORDER — LIDOCAINE-EPINEPHRINE-TETRACAINE (LET) SOLUTION
NASAL | Status: AC
  Filled 2017-11-25: qty 3

## 2017-11-25 MED ORDER — LIDOCAINE HCL 1 % IJ SOLN: 5.0000 mL | Freq: Once | INTRAMUSCULAR | Status: DC

## 2017-11-25 NOTE — ED Notes (Signed)
Pt family refused to let this RN help pt to bathroom. Refused wheel chair suggestion d/t patient's weakness and instability while walking.

## 2017-11-25 NOTE — ED Triage Notes (Signed)
Mechanical fall.  No LOC.  Abrasion to forehead, laceration to bridge of nose.  DSD applied. Bleeding controlled.

## 2017-11-25 NOTE — ED Provider Notes (Signed)
[facility] Emergency Department Provider Note  ____________________________________________  Time seen: Approximately 8:00 PM  I have reviewed the triage vital signs and the nursing notes.   HISTORY  Chief Complaint Fall and Facial Laceration    HPI Fred Roman is a 62 y.o. male with a history of frontotemporal dementia presents to the emergency department after a fall.  Patient's wife reports that she found patient laying face down on the tile in the bathroom after patient slipped and fell.  Fall was not witnessed.  Patient sustained an abrasion to forehead and a 3 cm vertical laceration to nose.  Patient is unable to provide full history and spouse provides some historical information.   Past Medical History:  Diagnosis Date  . Anxiety   . Depression   . Frequent falls   . 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 Hypertension   . Memory loss     Patient Active Problem List   Diagnosis Date Noted  . Dementia 07/13/2016  . 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 Memory loss 05/05/2016  . Abnormality of gait 05/05/2016  . Confusion 05/05/2016  . 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 AA (alcohol abuse) 07/08/2014    History reviewed. No pertinent surgical history.  Prior to Admission medications   Medication Sig Start Date End Date Taking? Authorizing Provider  Amantadine HCl 100 MG tablet Take 1 tablet (100 mg total) by mouth 2 (two) times daily. 12/19/15   Brandy Hale, MD  amLODipine (NORVASC) 5 MG tablet Take 5 mg by mouth daily. 05/04/16   [provider]  bisoprolol-hydrochlorothiazide (ZIAC) 5-6.25 MG tablet TAKE 1 TABLET BY MOUTH EVERY MORNING FOR BP 10/03/17   Lyndon Code, MD  busPIRone (BUSPAR) 15 MG tablet TAKE 1/2 TABLET BY MOUTH TWICE A DAY 02/12/16   Brandy Hale, MD  Dextromethorphan-Quinidine (NUEDEXTA) 20-10 MG CAPS One tab twice a day. 11/22/16   Levert Feinstein, MD  Folic Acid 20 MG CAPS Take  800 mcg by mouth 2 (two) times daily.    [provider]  hydrochlorothiazide (MICROZIDE) 12.5 MG capsule Take 12.5 mg by mouth daily. 09/20/15   [provider]  lovastatin (MEVACOR) 10 MG tablet TAKE 1 TABLET BY MOUTH AT  BEDTIME FOR CHOLESTEROL ,  PLAQUE 09/28/17   Carlean Jews, NP  memantine (NAMENDA) 10 MG tablet Take 1 tablet (10 mg total) by mouth 2 (two) times daily. 05/27/16   Levert Feinstein, MD  predniSONE (DELTASONE) 10 MG tablet Take 1 tablet (10 mg total) by mouth daily. 12/11/16   Cuthriell, Christiane Ha D, PA-C  sertraline (ZOLOFT) 100 MG tablet TAKE 1 TABLET BY MOUTH  EVERY EVENING FOR MOOD 09/15/17   Lyndon Code, MD  thiamine (VITAMIN B-1) 100 MG tablet Take 1 tablet (100 mg total) by mouth daily. 12/19/15   Brandy Hale, MD    Allergies Patient has no known allergies.  Family History  Problem Relation Age of Onset  . Stroke Mother   . Dementia Mother   . Heart disease Mother   . Diabetes Mother   . Aneurysm Mother   . Ovarian cancer Mother   . Alcohol abuse Father   . Depression Father   . Brain cancer Father   . Alcohol abuse Brother   . ALS Maternal Grandmother   . Prostate cancer Maternal Grandfather     Social History Social History   Tobacco Use  . Smoking status: Former Smoker    Last  attempt to quit: 11/17/2004    Years since quitting: 13.0  . Smokeless tobacco: Never Used  Substance Use Topics  . Alcohol use: No    Alcohol/week: 0.0 oz    Comment: Seldom use  . Drug use: No     Review of Systems  Constitutional: No fever/chills Eyes: No visual changes. No discharge ENT: No upper respiratory complaints. Cardiovascular: no chest pain. Respiratory: no cough. No SOB. Gastrointestinal: No abdominal pain.  No nausea, no vomiting.  No diarrhea.  No constipation. Genitourinary: Negative for dysuria. No hematuria Musculoskeletal: Negative for musculoskeletal pain. Skin: Patient has forehead abrasion and nose laceration. Neurological:  Negative for headaches, focal weakness or numbness.   ____________________________________________   PHYSICAL EXAM:  VITAL SIGNS: ED Triage Vitals  Enc Vitals Group     BP 11/25/17 1858 (!) 150/82     Pulse Rate 11/25/17 1858 93     Resp 11/25/17 1858 16     Temp 11/25/17 1858 98.3 F (36.8 C)     Temp Source 11/25/17 1858 Oral     SpO2 11/25/17 1858 96 %     Weight 11/25/17 1857 185 lb (83.9 kg)     Height 11/25/17 1857  (1.702 m)     Head Circumference --      Peak Flow --      Pain Score 11/25/17 1857 0     Pain Loc --      Pain Edu? --      Excl. in GC? --      Constitutional: Alert and oriented. Well appearing and in no acute distress. Eyes: Conjunctivae are normal. PERRL. EOMI. Head: Atraumatic. ENT:      Ears: TMs are pearly.      Nose: No congestion/rhinnorhea.      Mouth/Throat: Mucous membranes are moist.  Neck: No stridor.  Patient has cervical spine tenderness to palpation. Hematological/Lymphatic/Immunilogical: No cervical lymphadenopathy. Cardiovascular: Normal rate, regular rhythm. Normal S1 and S2.  Good peripheral circulation. Respiratory: Normal respiratory effort without tachypnea or retractions. Lungs CTAB. Good air entry to the bases with no decreased or absent breath sounds. Gastrointestinal: Bowel sounds 4 quadrants. Soft and nontender to palpation. No guarding or rigidity. No palpable masses. No distention. No CVA tenderness. Musculoskeletal: 5 out of 5 strength of the left upper extremity.  3 out of 5 strength of the right upper extremity.  Patient's wife states that this is his baseline. Neurologic: Patient is unable to speak in complete sentences.  He alternates between sitting and standing. Skin:  Skin is warm, dry and intact. No rash noted.   ____________________________________________   LABS (all labs ordered are listed, but only abnormal results are displayed)  Labs Reviewed - No data to  display ____________________________________________  EKG   ____________________________________________  RADIOLOGY Geraldo Pitter, personally viewed and evaluated these images as part of my medical decision making, as well as reviewing the written report by the radiologist.    Ct Head Wo Contrast  Result Date: 11/25/2017 CLINICAL DATA:  Facial laceration after fall. No loss of consciousness. EXAM: CT HEAD WITHOUT CONTRAST CT MAXILLOFACIAL WITHOUT CONTRAST CT CERVICAL SPINE WITHOUT CONTRAST TECHNIQUE: Multidetector CT imaging of the head, cervical spine, and maxillofacial structures were performed using the standard protocol without intravenous contrast. Multiplanar CT image reconstructions of the cervical spine and maxillofacial structures were also generated. COMPARISON:  None. FINDINGS: CT HEAD FINDINGS Brain: No evidence of acute infarction, hemorrhage, hydrocephalus, extra-axial collection or mass lesion/mass effect. Vascular: No hyperdense  vessel or unexpected calcification. Skull: Normal. Negative for fracture or focal lesion. Other: None. CT MAXILLOFACIAL FINDINGS Osseous: No fracture or mandibular dislocation. No destructive process. Orbits: Negative. No traumatic or inflammatory finding. Sinuses: Mild mucosal thickening is noted in both maxillary sinuses. No other abnormality is noted. Soft tissues: Negative. CT CERVICAL SPINE FINDINGS Alignment: Normal. Skull base and vertebrae: No acute fracture. No primary bone lesion or focal pathologic process. Soft tissues and spinal canal: No prevertebral fluid or swelling. No visible canal hematoma. Disc levels:  Normal. Upper chest: Negative. Other: None. IMPRESSION: Normal head CT. No significant abnormality seen in the maxillofacial region. Normal cervical spine. Electronically Signed   By: Lupita Raider, M.D.   On: 11/25/2017 20:32   Ct Cervical Spine Wo Contrast  Result Date: 11/25/2017 CLINICAL DATA:  Facial laceration after fall. No  loss of consciousness. EXAM: CT HEAD WITHOUT CONTRAST CT MAXILLOFACIAL WITHOUT CONTRAST CT CERVICAL SPINE WITHOUT CONTRAST TECHNIQUE: Multidetector CT imaging of the head, cervical spine, and maxillofacial structures were performed using the standard protocol without intravenous contrast. Multiplanar CT image reconstructions of the cervical spine and maxillofacial structures were also generated. COMPARISON:  None. FINDINGS: CT HEAD FINDINGS Brain: No evidence of acute infarction, hemorrhage, hydrocephalus, extra-axial collection or mass lesion/mass effect. Vascular: No hyperdense vessel or unexpected calcification. Skull: Normal. Negative for fracture or focal lesion. Other: None. CT MAXILLOFACIAL FINDINGS Osseous: No fracture or mandibular dislocation. No destructive process. Orbits: Negative. No traumatic or inflammatory finding. Sinuses: Mild mucosal thickening is noted in both maxillary sinuses. No other abnormality is noted. Soft tissues: Negative. CT CERVICAL SPINE FINDINGS Alignment: Normal. Skull base and vertebrae: No acute fracture. No primary bone lesion or focal pathologic process. Soft tissues and spinal canal: No prevertebral fluid or swelling. No visible canal hematoma. Disc levels:  Normal. Upper chest: Negative. Other: None. IMPRESSION: Normal head CT. No significant abnormality seen in the maxillofacial region. Normal cervical spine. Electronically Signed   By: Lupita Raider, M.D.   On: 11/25/2017 20:32   Ct Maxillofacial Wo Contrast  Result Date: 11/25/2017 CLINICAL DATA:  Facial laceration after fall. No loss of consciousness. EXAM: CT HEAD WITHOUT CONTRAST CT MAXILLOFACIAL WITHOUT CONTRAST CT CERVICAL SPINE WITHOUT CONTRAST TECHNIQUE: Multidetector CT imaging of the head, cervical spine, and maxillofacial structures were performed using the standard protocol without intravenous contrast. Multiplanar CT image reconstructions of the cervical spine and maxillofacial structures were also  generated. COMPARISON:  None. FINDINGS: CT HEAD FINDINGS Brain: No evidence of acute infarction, hemorrhage, hydrocephalus, extra-axial collection or mass lesion/mass effect. Vascular: No hyperdense vessel or unexpected calcification. Skull: Normal. Negative for fracture or focal lesion. Other: None. CT MAXILLOFACIAL FINDINGS Osseous: No fracture or mandibular dislocation. No destructive process. Orbits: Negative. No traumatic or inflammatory finding. Sinuses: Mild mucosal thickening is noted in both maxillary sinuses. No other abnormality is noted. Soft tissues: Negative. CT CERVICAL SPINE FINDINGS Alignment: Normal. Skull base and vertebrae: No acute fracture. No primary bone lesion or focal pathologic process. Soft tissues and spinal canal: No prevertebral fluid or swelling. No visible canal hematoma. Disc levels:  Normal. Upper chest: Negative. Other: None. IMPRESSION: Normal head CT. No significant abnormality seen in the maxillofacial region. Normal cervical spine. Electronically Signed   By: Lupita Raider, M.D.   On: 11/25/2017 20:32    ____________________________________________    PROCEDURES  Procedure(s) performed:    Procedures  LACERATION REPAIR Performed by: Orvil Feil Authorized by: Orvil Feil Consent: Verbal consent obtained. Risks and  benefits: risks, benefits and alternatives were discussed Consent given by: patient Patient identity confirmed: provided demographic data Prepped and Draped in normal sterile fashion Wound explored  Laceration Location: Distal forehead/proximal nose  Laceration Length: 3 cm  No Foreign Bodies seen or palpated  Anesthesia: LET  Anesthetic total: 3 ml  Irrigation method: syringe Amount of cleaning: standard  Skin closure: 5-0 Ethilon   Number of sutures: 4  Technique: Simple Interrupted   Patient tolerance: Patient tolerated the procedure well with no immediate complications.   Medications   lidocaine-EPINEPHrine-tetracaine (LET) solution (has no administration in time range)     ____________________________________________   INITIAL IMPRESSION / ASSESSMENT AND PLAN / ED COURSE  Pertinent labs & imaging results that were available during my care of the patient were reviewed by me and considered in my medical decision making (see chart for details).  Review of the [place] CSRS was performed in accordance of the [facility] prior to dispensing any controlled drugs.    Assessment and Plan: Facial laceration Head contusion Differential diagnosis included subdural hematoma, skull fracture, facial laceration and facial contusion.  No acute abnormality was identified on CT head, CT maxillofacial or CT cervical spine.  Patient underwent laceration repair in the emergency department without complication.  He was advised to have sutures removed by primary care in 5 days.  Vital signs are reassuring prior to discharge.  All patient questions were answered.    ____________________________________________  FINAL CLINICAL IMPRESSION(S) / ED DIAGNOSES  Final diagnoses:  Facial laceration, initial encounter      NEW MEDICATIONS STARTED DURING THIS VISIT:  ED Discharge Orders    None          This chart was dictated using voice recognition software/Dragon. Despite best efforts to proofread, errors can occur which can change the meaning. Any change was purely unintentional.    Gasper Lloyd 11/25/17 2153    Jene Every, MD 11/25/17 2227

## 2017-11-25 NOTE — ED Notes (Signed)
Pt ambulatory upon discharge; refused wheel chair. Wife walked pt out; this RN walked them to the lobby from flex. Verbalized understanding of discharge instructions and follow-up care. VSS. Skin warm and dry.

## 2017-11-27 ENCOUNTER — Other Ambulatory Visit: Payer: Self-pay | Admitting: Internal Medicine

## 2017-11-30 ENCOUNTER — Encounter: Payer: Self-pay | Admitting: Emergency Medicine

## 2017-11-30 ENCOUNTER — Emergency Department
Admission: EM | Admit: 2017-11-30 | Discharge: 2017-11-30 | Disposition: A | Discharge status: Home / Self Care | Payer: BLUE CROSS/BLUE SHIELD | Attending: Emergency Medicine | Admitting: Emergency Medicine

## 2017-11-30 ENCOUNTER — Other Ambulatory Visit: Payer: Self-pay

## 2017-11-30 DIAGNOSIS — I10 Essential (primary) hypertension: Secondary | ICD-10-CM | POA: Insufficient documentation

## 2017-11-30 DIAGNOSIS — Z87891 Personal history of nicotine dependence: Secondary | ICD-10-CM | POA: Diagnosis not present

## 2017-11-30 DIAGNOSIS — Z4802 Encounter for removal of sutures: Secondary | ICD-10-CM | POA: Insufficient documentation

## 2017-11-30 DIAGNOSIS — Y33XXXD Other specified events, undetermined intent, subsequent encounter: Secondary | ICD-10-CM | POA: Insufficient documentation

## 2017-11-30 DIAGNOSIS — F0390 Unspecified dementia without behavioral disturbance: Secondary | ICD-10-CM | POA: Diagnosis not present

## 2017-11-30 DIAGNOSIS — S0181XD Laceration without foreign body of other part of head, subsequent encounter: Secondary | ICD-10-CM | POA: Diagnosis present

## 2017-11-30 DIAGNOSIS — Z79899 Other long term (current) drug therapy: Secondary | ICD-10-CM | POA: Diagnosis not present

## 2017-11-30 NOTE — Discharge Instructions (Addendum)
Keep the wounds covered with a thin veil of antibiotic ointment to promote healing.

## 2017-11-30 NOTE — ED Triage Notes (Addendum)
Patient ambulatory to triage with steady gait, without difficulty or distress noted; pt reports here for stitch removal to bridge of nose; denies any c/o; wife reports that she came to ED because his PCP would not take them out since they didn't perform procedure

## 2017-11-30 NOTE — ED Provider Notes (Addendum)
[facility] Emergency Department Provider Note ____________________________________________  Time seen: 1920  I have reviewed the triage vital signs and the nursing notes.  HISTORY  Chief Complaint  Suture / Staple Removal  HPI Fred Roman is a 62 y.o. male presents to the ED for suture removal.  Patient was evaluated in the ED on 3/29 after mechanical fall at home, and treated for facial lacerations.  Patient's wounds were repaired with sutures.  He presents today without interim complaints.  Past Medical History:  Diagnosis Date  . Anxiety   . Depression   . Frequent falls   . 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 Hypertension   . Memory loss     Patient Active Problem List   Diagnosis Date Noted  . Dementia 07/13/2016  . 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 Memory loss 05/05/2016  . Abnormality of gait 05/05/2016  . Confusion 05/05/2016  . 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 AA (alcohol abuse) 07/08/2014    History reviewed. No pertinent surgical history.  Prior to Admission medications   Medication Sig Start Date End Date Taking? Authorizing Provider  Amantadine HCl 100 MG tablet Take 1 tablet (100 mg total) by mouth 2 (two) times daily. 12/19/15   Brandy Hale, MD  amLODipine (NORVASC) 5 MG tablet Take 5 mg by mouth daily. 05/04/16   [provider]  bisoprolol-hydrochlorothiazide (ZIAC) 5-6.25 MG tablet TAKE 1 TABLET BY MOUTH EVERY MORNING FOR BP 10/03/17   Lyndon Code, MD  busPIRone (BUSPAR) 15 MG tablet TAKE 1/2 TABLET BY MOUTH TWICE A DAY 02/12/16   Brandy Hale, MD  Dextromethorphan-Quinidine (NUEDEXTA) 20-10 MG CAPS One tab twice a day. 11/22/16   Levert Feinstein, MD  Folic Acid 20 MG CAPS Take 454 mcg by mouth 2 (two) times daily.    [provider]  hydrochlorothiazide (MICROZIDE) 12.5 MG capsule Take 12.5 mg by mouth daily. 09/20/15   [provider]  lovastatin (MEVACOR) 10  MG tablet TAKE 1 TABLET BY MOUTH AT  BEDTIME FOR CHOLESTEROL ,  PLAQUE 09/28/17   Carlean Jews, NP  memantine (NAMENDA) 10 MG tablet Take 1 tablet (10 mg total) by mouth 2 (two) times daily. 05/27/16   Levert Feinstein, MD  predniSONE (DELTASONE) 10 MG tablet Take 1 tablet (10 mg total) by mouth daily. 12/11/16   Cuthriell, Christiane Ha D, PA-C  sertraline (ZOLOFT) 100 MG tablet TAKE 1 TABLET BY MOUTH  EVERY EVENING FOR MOOD 11/28/17   Lyndon Code, MD  thiamine (VITAMIN B-1) 100 MG tablet Take 1 tablet (100 mg total) by mouth daily. 12/19/15   Brandy Hale, MD    Allergies Patient has no known allergies.  Family History  Problem Relation Age of Onset  . Stroke Mother   . Dementia Mother   . Heart disease Mother   . Diabetes Mother   . Aneurysm Mother   . Ovarian cancer Mother   . Alcohol abuse Father   . Depression Father   . Brain cancer Father   . Alcohol abuse Brother   . ALS Maternal Grandmother   . Prostate cancer Maternal Grandfather     Social History Social History   Tobacco Use  . Smoking status: Former Smoker    Last attempt to quit: 11/17/2004    Years since quitting: 13.0  . Smokeless tobacco: Never Used  Substance Use Topics  . Alcohol use: No    Alcohol/week: 0.0 oz    Comment:  Seldom use  . Drug use: No    Review of Systems  Constitutional: Negative for fever. Eyes: Negative for visual changes. ENT: Negative for sore throat.  Facial laceration status post repair as above. Cardiovascular: Negative for chest pain. Respiratory: Negative for shortness of breath. Musculoskeletal: Negative for back pain. Skin: Negative for rash. Neurological: Negative for headaches, focal weakness or numbness. ____________________________________________  PHYSICAL EXAM:  VITAL SIGNS: ED Triage Vitals  Enc Vitals Group     BP 11/30/17 2022 (!) 151/100     Pulse Rate 11/30/17 2022 (!) 109     Resp 11/30/17 2022 18     Temp 11/30/17 2022 98.2 F (36.8 C)     Temp Source  11/30/17 2022 Oral     SpO2 11/30/17 2022 99 %     Weight 11/30/17 2023 185 lb (83.9 kg)     Height 11/30/17 2023 5\' 7"  (1.702 m)     Head Circumference --      Peak Flow --      Pain Score 11/30/17 2022 0     Pain Loc --      Pain Edu? --      Excl. in GC? --     Constitutional: Alert and oriented. Well appearing and in no distress. Head: Normocephalic and atraumatic, except for an abrasion to the forehead and a laceration across the nasal bridge. Good wound healing is appreciated. Eyes: Conjunctivae are normal. Normal extraocular movements Nose: No congestion/rhinorrhea/epistaxis. Cardiovascular: Normal rate, regular rhythm. Respiratory: Normal respiratory effort.  Musculoskeletal: Nontender with normal range of motion in all extremities.  Neurologic:  Baseline gait with ataxia. Normal speech and language. No gross focal neurologic deficits are appreciated. Skin:  Skin is warm, dry and intact. No rash noted. ____________________________________________  PROCEDURES  .Suture Removal Date/Time: 11/30/2017 9:29 PM Performed by: Laren BoomPendleton, Kelley, RN Authorized by: Lissa HoardMenshew, Renaldo Gornick V Bacon, PA-C   Consent:    Consent obtained:  Verbal   Consent given by:  Patient Location:    Location:  Head/neck   Head/neck location:  Nose Procedure details:    Wound appearance:  No signs of infection   Number of sutures removed:  4 Post-procedure details:    Post-removal:  No dressing applied   Patient tolerance of procedure:  Tolerated well, no immediate complications  ____________________________________________  INITIAL IMPRESSION / ASSESSMENT AND PLAN / ED COURSE  Patient with ED evaluation and suture removal following a fall and suture repair to the nasal bridge.  Patient with no interim complaints has sutures removed and the wound is left open.  He is to follow-up with a primary provider for ongoing symptoms. ____________________________________________  FINAL CLINICAL IMPRESSION(S)  / ED DIAGNOSES  Final diagnoses:  Visit for suture removal      Karianne Nogueira, Charlesetta IvoryJenise V Bacon, PA-C 11/30/17 2133    Daun Rens, Charlesetta IvoryJenise V Bacon, PA-C 11/30/17 2134    Rockne MenghiniNorman, Anne-Caroline, MD 11/30/17 [phone]

## 2017-11-30 NOTE — ED Notes (Signed)
Sutures removed from nose. No s/s of infection.

## 2017-11-30 NOTE — ED Notes (Signed)
Pt ambulatory to POV without difficulty. VSS. NAD. Discharge instructions and follow up discussed.

## 2017-12-01 ENCOUNTER — Other Ambulatory Visit: Payer: Self-pay | Admitting: Internal Medicine

## 2017-12-12 ENCOUNTER — Other Ambulatory Visit: Payer: Self-pay

## 2017-12-12 MED ORDER — BISOPROLOL-HYDROCHLOROTHIAZIDE 5-6.25 MG PO TABS: ORAL_TABLET | ORAL | 1 refills | Status: AC

## 2017-12-19 ENCOUNTER — Telehealth: Payer: Self-pay

## 2017-12-20 NOTE — Telephone Encounter (Signed)
Approved verbal order for PT

## 2018-01-06 ENCOUNTER — Telehealth: Payer: Self-pay

## 2018-01-06 NOTE — Telephone Encounter (Signed)
Note sent to provider 

## 2018-02-06 DIAGNOSIS — G249 Dystonia, unspecified: Secondary | ICD-10-CM | POA: Diagnosis not present

## 2018-02-06 DIAGNOSIS — G3185 Corticobasal degeneration: Secondary | ICD-10-CM | POA: Diagnosis not present

## 2018-02-06 DIAGNOSIS — Z6826 Body mass index (BMI) 26.0-26.9, adult: Secondary | ICD-10-CM | POA: Diagnosis not present

## 2018-02-07 ENCOUNTER — Other Ambulatory Visit: Payer: Self-pay | Admitting: Internal Medicine

## 2018-03-06 ENCOUNTER — Other Ambulatory Visit: Payer: Self-pay

## 2018-03-07 ENCOUNTER — Other Ambulatory Visit: Payer: Self-pay

## 2018-03-07 MED ORDER — LOVASTATIN 10 MG PO TABS: ORAL_TABLET | ORAL | 1 refills | Status: DC

## 2018-03-17 DIAGNOSIS — G248 Other dystonia: Secondary | ICD-10-CM | POA: Diagnosis not present

## 2018-04-12 DIAGNOSIS — H02889 Meibomian gland dysfunction of unspecified eye, unspecified eyelid: Secondary | ICD-10-CM | POA: Diagnosis not present

## 2018-04-28 DIAGNOSIS — Z6824 Body mass index (BMI) 24.0-24.9, adult: Secondary | ICD-10-CM | POA: Diagnosis not present

## 2018-04-28 DIAGNOSIS — G248 Other dystonia: Secondary | ICD-10-CM | POA: Diagnosis not present

## 2018-05-08 ENCOUNTER — Other Ambulatory Visit: Payer: Self-pay | Admitting: Internal Medicine

## 2018-05-08 NOTE — Telephone Encounter (Signed)
Last visit 10/18 no next

## 2018-05-09 ENCOUNTER — Telehealth: Payer: Self-pay

## 2018-05-09 NOTE — Telephone Encounter (Signed)
lmom pt need appt for further refills  

## 2018-05-10 ENCOUNTER — Other Ambulatory Visit: Payer: Self-pay | Admitting: Internal Medicine

## 2018-06-08 DIAGNOSIS — R2681 Unsteadiness on feet: Secondary | ICD-10-CM | POA: Diagnosis not present

## 2018-06-08 DIAGNOSIS — G218 Other secondary parkinsonism: Secondary | ICD-10-CM | POA: Diagnosis not present

## 2018-06-08 DIAGNOSIS — F0280 Dementia in other diseases classified elsewhere without behavioral disturbance: Secondary | ICD-10-CM | POA: Diagnosis not present

## 2018-06-08 DIAGNOSIS — G3109 Other frontotemporal dementia: Secondary | ICD-10-CM | POA: Diagnosis not present

## 2018-06-08 DIAGNOSIS — Z6823 Body mass index (BMI) 23.0-23.9, adult: Secondary | ICD-10-CM | POA: Diagnosis not present

## 2018-06-08 DIAGNOSIS — R634 Abnormal weight loss: Secondary | ICD-10-CM | POA: Diagnosis not present

## 2018-06-08 DIAGNOSIS — R69 Illness, unspecified: Secondary | ICD-10-CM | POA: Diagnosis not present

## 2018-06-14 ENCOUNTER — Encounter: Payer: Self-pay | Admitting: Adult Health

## 2018-06-14 ENCOUNTER — Ambulatory Visit (INDEPENDENT_AMBULATORY_CARE_PROVIDER_SITE_OTHER): Payer: Medicare HMO | Admitting: Adult Health

## 2018-06-14 VITALS — BP 132/82 | HR 77 | Resp 16 | Ht 65.0 in | Wt 149.0 lb

## 2018-06-14 DIAGNOSIS — Z0001 Encounter for general adult medical examination with abnormal findings: Secondary | ICD-10-CM

## 2018-06-14 DIAGNOSIS — R259 Unspecified abnormal involuntary movements: Secondary | ICD-10-CM

## 2018-06-14 DIAGNOSIS — G1229 Other motor neuron disease: Secondary | ICD-10-CM | POA: Diagnosis not present

## 2018-06-14 DIAGNOSIS — G1220 Motor neuron disease, unspecified: Secondary | ICD-10-CM

## 2018-06-14 DIAGNOSIS — G3109 Other frontotemporal dementia: Secondary | ICD-10-CM

## 2018-06-14 DIAGNOSIS — I10 Essential (primary) hypertension: Secondary | ICD-10-CM

## 2018-06-14 DIAGNOSIS — R69 Illness, unspecified: Secondary | ICD-10-CM | POA: Diagnosis not present

## 2018-06-14 NOTE — Progress Notes (Addendum)
[facility] [address] [place], [place] [address]  Internal MEDICINE  Office Visit Note  Patient Name: Fred Roman  [id number]  [id number]  Date of Service: 06/14/2018  Chief Complaint  Patient presents with  . Annual Exam  . Hypertension  . Dementia     HPI Pt is here for routine health maintenance examination.  The patient is an ill-appearing 62 year old Caucasian male.  His wife is with him in the exam room.  He has a history of her frontal lobe degeneration with parkinsonian symptoms, high blood pressure, and dementia.  Fred Roman has a significant left leg tremor.  His wife reports they are finishing a few treatments for his muscle contractures at this time.  After his treatments are complete, they are attempting to have the patient admitted to home hospice care.  He has lost 36 pounds in the last 8 months.  He is appetite is declining and his balance is becoming noticeably worse.  He would benefit greatly from in-home assistance.  After discussing this with his wife, hospice care seems like an appropriate choice for him.  He has already weaned off of most of his medications.  He does not use tobacco products, alcohol, or illicit drugs.  He is a former drinker, however his wife reports he has not drank in at least 2 years.  Current Medication: Outpatient Encounter Medications as of 06/14/2018  Medication Sig Note  . bisoprolol-hydrochlorothiazide (ZIAC) 5-6.25 MG tablet Take 1 tab po daily for bp   . sertraline (ZOLOFT) 100 MG tablet TAKE 1 TABLET BY MOUTH  EVERY EVENING FOR MOOD   . traZODone (DESYREL) 50 MG tablet 1/2 to 1 tablets ( 25 mg to 50 mg) every 8 hours as needed for agitation   . [DISCONTINUED] Amantadine HCl 100 MG tablet Take 1 tablet (100 mg total) by mouth 2 (two) times daily. (Patient not taking: Reported on 06/14/2018)   . [DISCONTINUED] amLODipine (NORVASC) 5 MG tablet Take 5 mg by mouth daily. 05/27/2016: Received from: External  Pharmacy Received Sig: TAKE 1 TABLET BY MOUTH EVERY DAY  . [DISCONTINUED] busPIRone (BUSPAR) 15 MG tablet TAKE 1/2 TABLET BY MOUTH TWICE A DAY (Patient not taking: Reported on 06/14/2018)   . [DISCONTINUED] busPIRone (BUSPAR) 7.5 MG tablet TAKE 1 TABLET BY MOUTH TWICE A DAY (Patient not taking: Reported on 06/14/2018)   . [DISCONTINUED] Dextromethorphan-Quinidine (NUEDEXTA) 20-10 MG CAPS One tab twice a day. (Patient not taking: Reported on 06/14/2018)   . [DISCONTINUED] Folic Acid 20 MG CAPS Take 295 mcg by mouth 2 (two) times daily. 11/18/2015: Received from: [facility] Received Sig: Take by mouth.  . [DISCONTINUED] hydrochlorothiazide (MICROZIDE) 12.5 MG capsule Take 12.5 mg by mouth daily. 11/18/2015: Received from: External Pharmacy Received Sig: TAKE ONE CAPSULE BY MOUTH EVERY MORNING FOR BLOOD PRESSURE  . [DISCONTINUED] lovastatin (MEVACOR) 10 MG tablet TAKE 1 TABLET BY MOUTH NIGHTLY FOR CHOLESTEROL/PLAQUE. (Patient not taking: Reported on 06/14/2018)   . [DISCONTINUED] memantine (NAMENDA) 10 MG tablet Take 1 tablet (10 mg total) by mouth 2 (two) times daily. (Patient not taking: Reported on 06/14/2018)   . [DISCONTINUED] predniSONE (DELTASONE) 10 MG tablet Take 1 tablet (10 mg total) by mouth daily. (Patient not taking: Reported on 06/14/2018)   . [DISCONTINUED] thiamine (VITAMIN B-1) 100 MG tablet Take 1 tablet (100 mg total) by mouth daily. (Patient not taking: Reported on 06/14/2018)    No facility-administered encounter medications on file as of 06/14/2018.     Surgical  History: History reviewed. No pertinent surgical history.  Medical History: Past Medical History:  Diagnosis Date  . Anxiety   . Depression   . Frequent falls   . 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 Hypertension   . Memory loss     Family History: Family History  Problem Relation Age of Onset  . Stroke Mother   . Dementia Mother   . Heart disease  Mother   . Diabetes Mother   . Aneurysm Mother   . Ovarian cancer Mother   . Alcohol abuse Father   . Depression Father   . Brain cancer Father   . Alcohol abuse Brother   . ALS Maternal Grandmother   . Prostate cancer Maternal Grandfather     Depression screen [facility] 2/9 06/14/2018  Decreased Interest 0  Down, Depressed, Hopeless 0  PHQ - 2 Score 0    Functional Status Survey: Is the patient deaf or have difficulty hearing?: No Does the patient have difficulty seeing, even when wearing glasses/contacts?: No Does the patient have difficulty concentrating, remembering, or making decisions?: Yes Does the patient have difficulty walking or climbing stairs?: Yes Does the patient have difficulty dressing or bathing?: Yes Does the patient have difficulty doing errands alone such as visiting a doctor's office or shopping?: Yes  MMSE - Mini Mental State Exam 05/27/2016 05/05/2016  Orientation to time 5 3  Orientation to Place 5 5  Registration 3 3  Attention/ Calculation 5 0  Recall 3 2  Language- name 2 objects 2 2  Language- repeat 1 1  Language- follow 3 step command 3 3  Language- read & follow direction 1 1  Write a sentence 1 0  Copy design 0 1  Total score 29 21    Fall Risk  06/14/2018  Falls in the past year? Yes  Number falls in past yr: 2 or more  Injury with Fall? Yes     Review of Systems  Constitutional: Negative.  Negative for chills, fatigue and unexpected weight change.  HENT: Negative.  Negative for congestion, rhinorrhea, sneezing and sore throat.   Eyes: Negative for redness.  Respiratory: Negative.  Negative for cough, chest tightness and shortness of breath.   Cardiovascular: Negative.  Negative for chest pain and palpitations.  Gastrointestinal: Negative.  Negative for abdominal pain, constipation, diarrhea, nausea and vomiting.  Endocrine: Negative.   Genitourinary: Negative.  Negative for dysuria and frequency.  Musculoskeletal: Negative.  Negative  for arthralgias, back pain, joint swelling and neck pain.  Skin: Negative.  Negative for rash.  Allergic/Immunologic: Negative.   Neurological: Positive for tremors. Negative for numbness.  Hematological: Negative for adenopathy. Does not bruise/bleed easily.  Psychiatric/Behavioral: Negative.  Negative for behavioral problems, sleep disturbance and suicidal ideas. The patient is not nervous/anxious.      Vital Signs: BP 132/82   Pulse 77   Resp 16   Ht 5\' 5"  (1.651 m)   Wt 149 lb (67.6 kg)   SpO2 95%   BMI 24.79 kg/m    Physical Exam  Constitutional: He appears well-developed and well-nourished. No distress.  HENT:  Head: Normocephalic and atraumatic.  Mouth/Throat: Oropharynx is clear and moist. No oropharyngeal exudate.  Eyes: Pupils are equal, round, and reactive to light. EOM are normal.  Neck: Normal range of motion. Neck supple. No JVD present. No tracheal deviation present. No thyromegaly present.  Cardiovascular: Normal rate, regular rhythm and normal heart sounds. Exam reveals no gallop and  no friction rub.  No murmur heard. Pulmonary/Chest: Effort normal and breath sounds normal. No respiratory distress. He has no wheezes. He has no rales. He exhibits no tenderness.  Abdominal: Soft. There is no tenderness. There is no guarding.  Musculoskeletal: Normal range of motion.  Lymphadenopathy:    He has no cervical adenopathy.  Neurological: He is alert. No cranial nerve deficit.  Pt disoriented and has resting tremor of left leg.  Skin: Skin is warm and dry. He is not diaphoretic.  Psychiatric: He has a normal mood and affect. His behavior is normal. Judgment and thought content normal.  Nursing note and vitals reviewed.    LABS: No results found for this or any previous visit (from the past 2160 hour(s)).  Assessment/Plan: 1. Encounter for general adult medical examination with abnormal findings Preventative health maintenance and labs are deferred at this time.   Wife does not wish to put patient through any unnecessary procedures or lab draws.  I agree with his assessment at this time.  I discussed with wife that if at any point she needs refills on medications, a referral for hospice care, or if week in any way can facilitate her caring for her husband at home to please let him know.  2. 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  3. Essential hypertension His blood pressure is currently stable.  Continue Ziac as prescribed at this time.  4. Parkinsonian features Patient has parkinsonian-like tremor of the left leg, that affects his balance.  His gait is very ataxic at this time.  Discussed the concerns with wife.  Patient did not receive any medications for this at this time, patient's wife understands that tremors will most likely get worse in the interim.  General Counseling: Fred Roman understanding of the findings of todays visit and agrees with plan of treatment. I have discussed any further diagnostic evaluation that may be needed or ordered today. We also reviewed his medications today. he has been encouraged to call the office with any questions or concerns that should arise related to todays visit.   No orders of the defined types were placed in this encounter.   No orders of the defined types were placed in this encounter.   Time spent: 25 Minutes   This patient was seen by Blima Ledger AGNP-C in Collaboration with Dr Lyndon Code as a part of collaborative care agreement    Fred Roman AGNP-C Internal Medicine

## 2018-06-14 NOTE — Patient Instructions (Signed)
Dementia Caregiver Guide Dementia is a term used to describe a number of symptoms that affect memory and thinking. The most common symptoms include:  Memory loss.  Trouble with language and communication.  Trouble concentrating.  Poor judgment.  Problems with reasoning.  Child-like behavior and language.  Extreme anxiety.  Angry outbursts.  Wandering from home or public places.  Dementia usually gets worse slowly over time. In the early stages, people with dementia can stay independent and safe with some help. In later stages, they need help with daily tasks such as dressing, grooming, and using the bathroom. How to help the person with dementia cope Dementia can be frightening and confusing. Here are some tips to help the person with dementia cope with changes caused by the disease. General tips  Keep the person on track with his or her routine.  Try to identify areas where the person may need help.  Be supportive, patient, calm, and encouraging.  Gently remind the person that adjusting to changes takes time.  Help with the tasks that the person has asked for help with.  Keep the person involved in daily tasks and decisions as much as possible.  Encourage conversation, but try not to get frustrated or harried if the person struggles to find words or does not seem to appreciate your help. Communication tips  When the person is talking or seems frustrated, make eye contact and hold the person's hand.  Ask specific questions that need yes or no answers.  Use simple words, short sentences, and a calm voice. Only give one direction at a time.  When offering choices, limit them to just 1 or 2.  Avoid correcting the person in a negative way.  If the person is struggling to find the right words, gently try to help him or her. How to recognize symptoms of stress Symptoms of stress in caregivers include:  Feeling frustrated or angry with the person with  dementia.  Denying that the person has dementia or that his or her symptoms will not improve.  Feeling hopeless and unappreciated.  Difficulty sleeping.  Difficulty concentrating.  Feeling anxious, irritable, or depressed.  Developing stress-related health problems.  Feeling like you have too little time for your own life.  Follow these instructions at home:  Make sure that you and the person you are caring for: ? Get regular sleep. ? Exercise regularly. ? Eat regular, nutritious meals. ? Drink enough fluid to keep your urine clear or pale yellow. ? Take over-the-counter and prescription medicines only as told by your health care providers. ? Attend all scheduled health care appointments.  Join a support group with others who are caregivers.  Ask about respite care resources so that you can have a regular break from the stress of caregiving.  Look for signs of stress in yourself and in the person you are caring for. If you notice signs of stress, take steps to manage it.  Consider any safety risks and take steps to avoid them.  Organize medications in a pill box for each day of the week.  Create a plan to handle any legal or financial matters. Get legal or financial advice if needed.  Keep a calendar in a central location to remind the person of appointments or other activities. Tips for reducing the risk of injury  Keep floors clear of clutter. Remove rugs, magazine racks, and floor lamps.  Keep hallways well lit, especially at night.  Put a handrail and nonslip mat in the bathtub   or shower.  Put childproof locks on cabinets that contain dangerous items, such as medicines, alcohol, guns, toxic cleaning items, sharp tools or utensils, matches, and lighters.  Put the locks in places where the person cannot see or reach them easily. This will help ensure that the person does not wander out of the house and get lost.  Be prepared for emergencies. Keep a list of  emergency phone numbers and addresses in a convenient area.  Remove car keys and lock garage doors so that the person does not try to get in the car and drive.  Have the person wear a bracelet that tracks locations and identifies the person as having memory problems. This should be worn at all times for safety. Where to find support: Many individuals and organizations offer support. These include:  Support groups for people with dementia and for caregivers.  Counselors or therapists.  Home health care services.  Adult day care centers.  Where to find more information: Alzheimer's Association: www.alz.org Contact a health care provider if:  The person's health is rapidly getting worse.  You are no longer able to care for the person.  Caring for the person is affecting your physical and emotional health.  The person threatens himself or herself, you, or anyone else. Summary  Dementia is a term used to describe a number of symptoms that affect memory and thinking.  Dementia usually gets worse slowly over time.  Take steps to reduce the person's risk of injury, and to plan for future care.  Caregivers need support, relief from caregiving, and time for their own lives. This information is not intended to replace advice given to you by your health care provider. Make sure you discuss any questions you have with your health care provider. Document Released: 07/20/2016 Document Revised: 07/20/2016 Document Reviewed: 07/20/2016 Elsevier Interactive Patient Education  2018 [employer]vier Inc.  

## 2018-06-27 DIAGNOSIS — Z9181 History of falling: Secondary | ICD-10-CM | POA: Diagnosis not present

## 2018-06-27 DIAGNOSIS — E785 Hyperlipidemia, unspecified: Secondary | ICD-10-CM | POA: Diagnosis not present

## 2018-06-27 DIAGNOSIS — I10 Essential (primary) hypertension: Secondary | ICD-10-CM | POA: Diagnosis not present

## 2018-06-27 DIAGNOSIS — Z87891 Personal history of nicotine dependence: Secondary | ICD-10-CM | POA: Diagnosis not present

## 2018-06-27 DIAGNOSIS — G20 Parkinson's disease: Secondary | ICD-10-CM | POA: Diagnosis not present

## 2018-06-27 DIAGNOSIS — G248 Other dystonia: Secondary | ICD-10-CM | POA: Diagnosis not present

## 2018-06-27 DIAGNOSIS — G3109 Other frontotemporal dementia: Secondary | ICD-10-CM | POA: Diagnosis not present

## 2018-06-27 DIAGNOSIS — R69 Illness, unspecified: Secondary | ICD-10-CM | POA: Diagnosis not present

## 2018-06-27 DIAGNOSIS — M79601 Pain in right arm: Secondary | ICD-10-CM | POA: Diagnosis not present

## 2018-06-30 ENCOUNTER — Telehealth: Payer: Self-pay

## 2018-06-30 DIAGNOSIS — E785 Hyperlipidemia, unspecified: Secondary | ICD-10-CM | POA: Diagnosis not present

## 2018-06-30 DIAGNOSIS — G20 Parkinson's disease: Secondary | ICD-10-CM | POA: Diagnosis not present

## 2018-06-30 DIAGNOSIS — G3109 Other frontotemporal dementia: Secondary | ICD-10-CM | POA: Diagnosis not present

## 2018-06-30 DIAGNOSIS — R69 Illness, unspecified: Secondary | ICD-10-CM | POA: Diagnosis not present

## 2018-06-30 DIAGNOSIS — Z87891 Personal history of nicotine dependence: Secondary | ICD-10-CM | POA: Diagnosis not present

## 2018-06-30 DIAGNOSIS — I10 Essential (primary) hypertension: Secondary | ICD-10-CM | POA: Diagnosis not present

## 2018-06-30 DIAGNOSIS — Z9181 History of falling: Secondary | ICD-10-CM | POA: Diagnosis not present

## 2018-06-30 NOTE — Telephone Encounter (Signed)
Spoke with [facility] aldriene [phone] for PT for 2 times a week for 5 weeks and OT and speech therapy evaluation

## 2018-07-20 DIAGNOSIS — Z6823 Body mass index (BMI) 23.0-23.9, adult: Secondary | ICD-10-CM | POA: Diagnosis not present

## 2018-07-20 DIAGNOSIS — G248 Other dystonia: Secondary | ICD-10-CM | POA: Diagnosis not present

## 2018-07-20 DIAGNOSIS — R634 Abnormal weight loss: Secondary | ICD-10-CM | POA: Diagnosis not present

## 2018-07-20 DIAGNOSIS — F419 Anxiety disorder, unspecified: Secondary | ICD-10-CM | POA: Diagnosis not present

## 2018-07-20 DIAGNOSIS — R69 Illness, unspecified: Secondary | ICD-10-CM | POA: Diagnosis not present

## 2018-07-20 DIAGNOSIS — F0280 Dementia in other diseases classified elsewhere without behavioral disturbance: Secondary | ICD-10-CM | POA: Diagnosis not present

## 2018-07-20 DIAGNOSIS — G3109 Other frontotemporal dementia: Secondary | ICD-10-CM | POA: Diagnosis not present

## 2018-07-20 DIAGNOSIS — R451 Restlessness and agitation: Secondary | ICD-10-CM | POA: Diagnosis not present

## 2018-07-20 DIAGNOSIS — R2681 Unsteadiness on feet: Secondary | ICD-10-CM | POA: Diagnosis not present

## 2018-09-07 DIAGNOSIS — G3185 Corticobasal degeneration: Secondary | ICD-10-CM | POA: Diagnosis not present

## 2018-09-07 DIAGNOSIS — Z515 Encounter for palliative care: Secondary | ICD-10-CM | POA: Diagnosis not present

## 2018-09-07 DIAGNOSIS — G3109 Other frontotemporal dementia: Secondary | ICD-10-CM | POA: Diagnosis not present

## 2018-09-07 DIAGNOSIS — R69 Illness, unspecified: Secondary | ICD-10-CM | POA: Diagnosis not present

## 2018-11-22 DIAGNOSIS — G3109 Other frontotemporal dementia: Secondary | ICD-10-CM | POA: Diagnosis not present

## 2018-11-22 DIAGNOSIS — Z515 Encounter for palliative care: Secondary | ICD-10-CM | POA: Diagnosis not present

## 2018-11-22 DIAGNOSIS — R69 Illness, unspecified: Secondary | ICD-10-CM | POA: Diagnosis not present

## 2019-01-15 DIAGNOSIS — G3109 Other frontotemporal dementia: Secondary | ICD-10-CM | POA: Diagnosis not present

## 2019-01-15 DIAGNOSIS — Z7189 Other specified counseling: Secondary | ICD-10-CM | POA: Diagnosis not present

## 2019-01-15 DIAGNOSIS — G3185 Corticobasal degeneration: Secondary | ICD-10-CM | POA: Diagnosis not present

## 2019-01-15 DIAGNOSIS — R69 Illness, unspecified: Secondary | ICD-10-CM | POA: Diagnosis not present

## 2019-03-22 DIAGNOSIS — G3101 Pick's disease: Secondary | ICD-10-CM | POA: Diagnosis not present

## 2019-03-22 DIAGNOSIS — R69 Illness, unspecified: Secondary | ICD-10-CM | POA: Diagnosis not present

## 2019-03-22 DIAGNOSIS — G248 Other dystonia: Secondary | ICD-10-CM | POA: Diagnosis not present

## 2019-03-22 DIAGNOSIS — G3185 Corticobasal degeneration: Secondary | ICD-10-CM | POA: Diagnosis not present

## 2019-03-22 DIAGNOSIS — G20 Parkinson's disease: Secondary | ICD-10-CM | POA: Diagnosis not present

## 2019-08-13 DIAGNOSIS — Z515 Encounter for palliative care: Secondary | ICD-10-CM | POA: Diagnosis not present

## 2019-08-13 DIAGNOSIS — G20 Parkinson's disease: Secondary | ICD-10-CM | POA: Diagnosis not present

## 2019-08-13 DIAGNOSIS — R69 Illness, unspecified: Secondary | ICD-10-CM | POA: Diagnosis not present

## 2019-08-13 DIAGNOSIS — G248 Other dystonia: Secondary | ICD-10-CM | POA: Diagnosis not present

## 2019-08-13 DIAGNOSIS — G3101 Pick's disease: Secondary | ICD-10-CM | POA: Diagnosis not present

## 2019-08-13 DIAGNOSIS — G3185 Corticobasal degeneration: Secondary | ICD-10-CM | POA: Diagnosis not present

## 2019-08-13 DIAGNOSIS — G3109 Other frontotemporal dementia: Secondary | ICD-10-CM | POA: Diagnosis not present

## 2019-08-13 DIAGNOSIS — Z636 Dependent relative needing care at home: Secondary | ICD-10-CM | POA: Diagnosis not present

## 2019-08-13 DIAGNOSIS — R451 Restlessness and agitation: Secondary | ICD-10-CM | POA: Diagnosis not present

## 2019-08-13 DIAGNOSIS — R634 Abnormal weight loss: Secondary | ICD-10-CM | POA: Diagnosis not present

## 2019-08-13 DIAGNOSIS — F419 Anxiety disorder, unspecified: Secondary | ICD-10-CM | POA: Diagnosis not present

## 2019-08-21 DIAGNOSIS — R634 Abnormal weight loss: Secondary | ICD-10-CM | POA: Diagnosis not present

## 2019-08-21 DIAGNOSIS — R69 Illness, unspecified: Secondary | ICD-10-CM | POA: Diagnosis not present

## 2019-08-21 DIAGNOSIS — G3185 Corticobasal degeneration: Secondary | ICD-10-CM | POA: Diagnosis not present

## 2019-08-21 DIAGNOSIS — G3109 Other frontotemporal dementia: Secondary | ICD-10-CM | POA: Diagnosis not present

## 2019-08-21 DIAGNOSIS — Z515 Encounter for palliative care: Secondary | ICD-10-CM | POA: Diagnosis not present

## 2019-08-21 DIAGNOSIS — R1311 Dysphagia, oral phase: Secondary | ICD-10-CM | POA: Diagnosis not present

## 2019-08-21 DIAGNOSIS — G248 Other dystonia: Secondary | ICD-10-CM | POA: Diagnosis not present

## 2019-10-27 IMAGING — CT CT HEAD W/O CM
4 of 11 series · 16 of 47 positions shown, 18 images · non-contrast
Comparison: None.

CLINICAL DATA: Facial laceration after fall. No loss of
consciousness.

EXAM:
CT HEAD WITHOUT CONTRAST
CT MAXILLOFACIAL WITHOUT CONTRAST
CT CERVICAL SPINE WITHOUT CONTRAST
TECHNIQUE: Multidetector CT imaging of the head, cervical spine, and
maxillofacial structures were performed using the standard protocol
without intravenous contrast. Multiplanar CT image reconstructions
of the cervical spine and maxillofacial structures were also
generated.

[Series 4: coronal soft tissue · coronal · 0.31mm/px · 2 of 59 slices shown]
[im 20/59  brain]
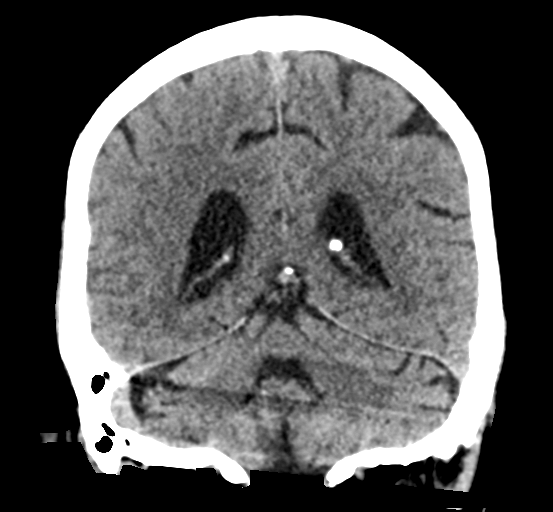
[im 39/59  brain]
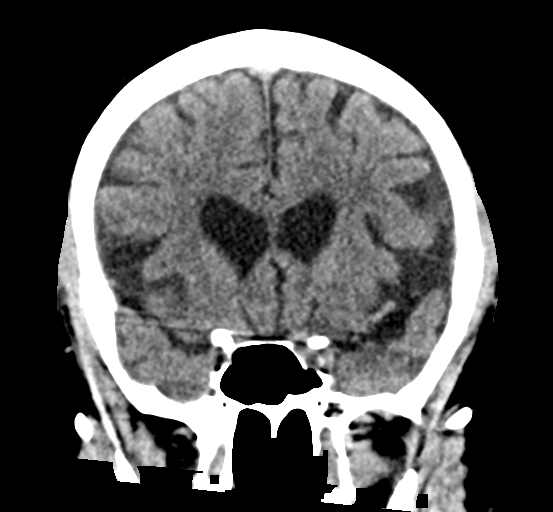

[Series 5: max soft (person_name) · axial · 0.33mm/px · z∈[-198,-82]mm · 6 of 82 slices shown, 8 images]
[im 12/82  brain]
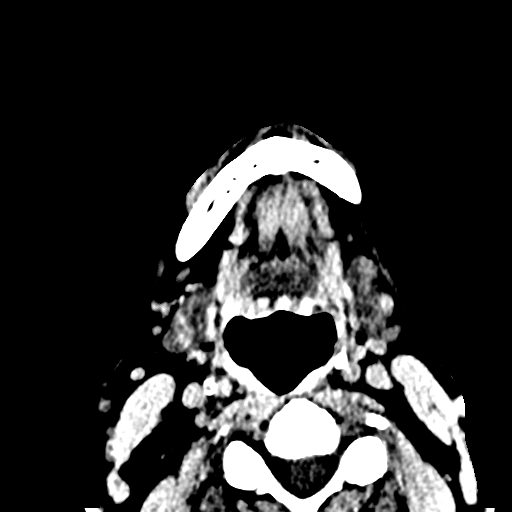
[im 12/82  bone]
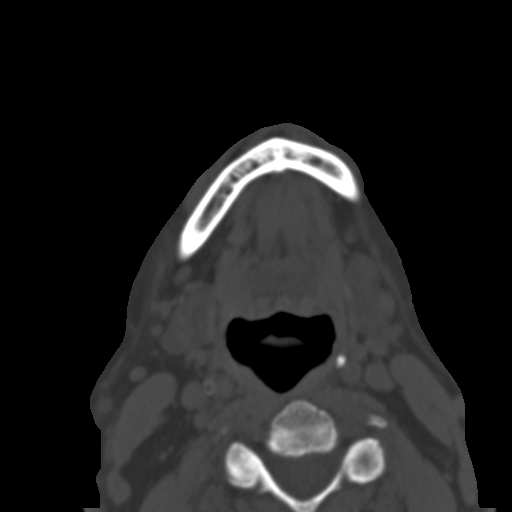
[im 24/82  brain]
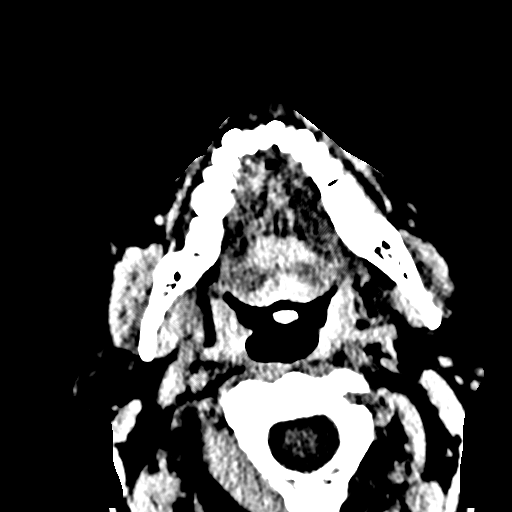
[im 35/82  brain]
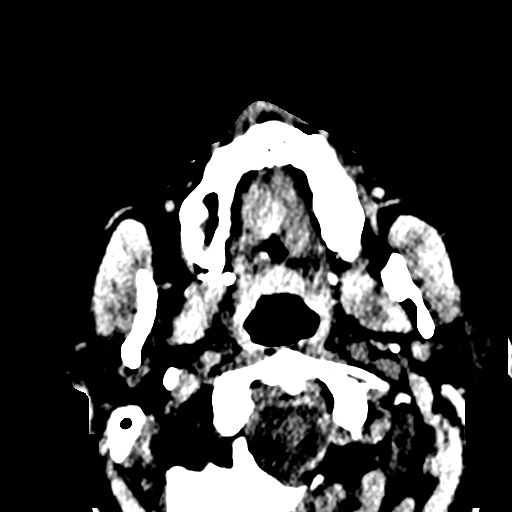
[im 47/82  brain]
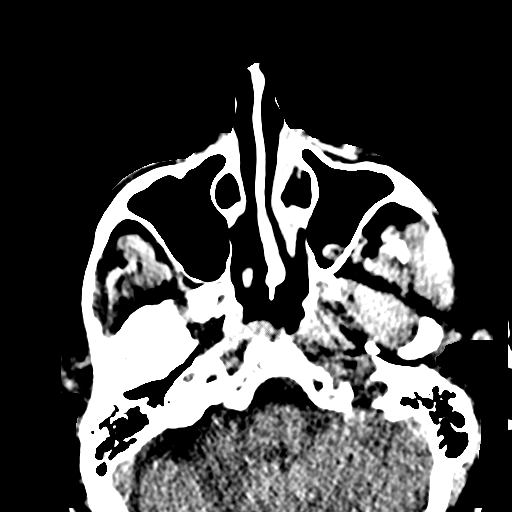
[im 58/82  brain]
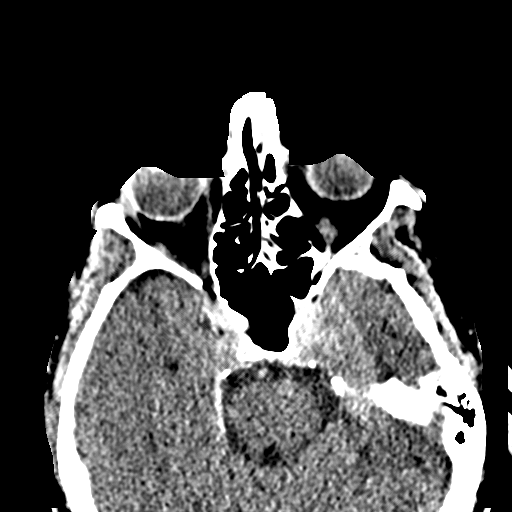
[im 58/82  bone]
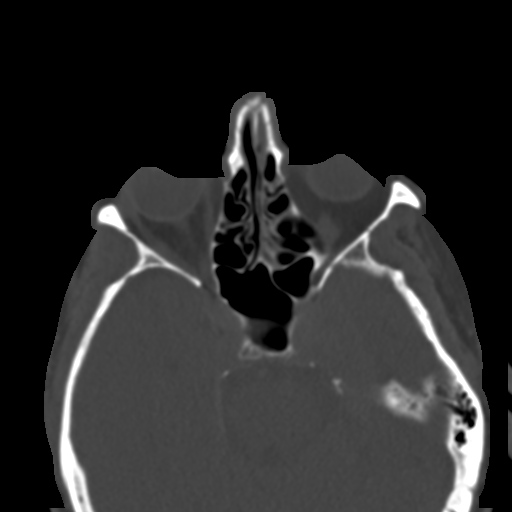
[im 70/82  brain]
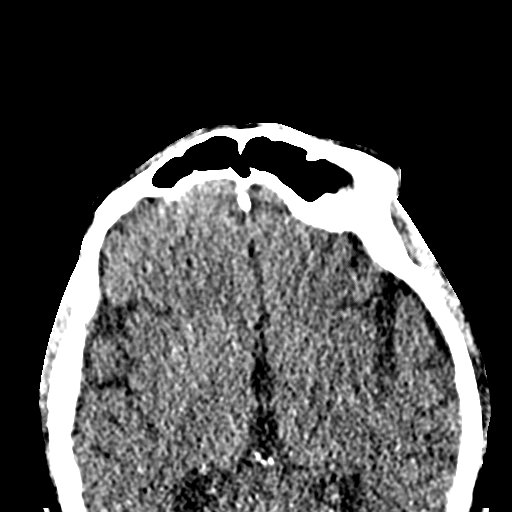

[Series 7: sagittal soft tissue · sagittal · 0.33mm/px · 1 of 50 slices shown]
[im 25/50  brain]
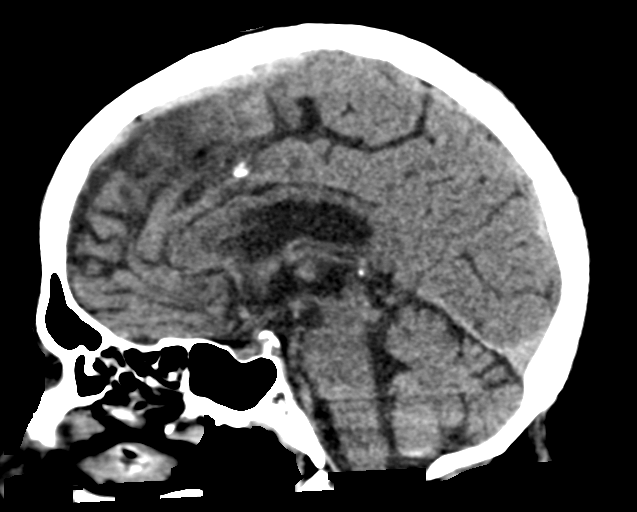

[Series 19: orthogonal bone · axial · 0.23mm/px · z∈[-289,-174]mm · 7 of 93 slices shown]
[im 12/93  bone]
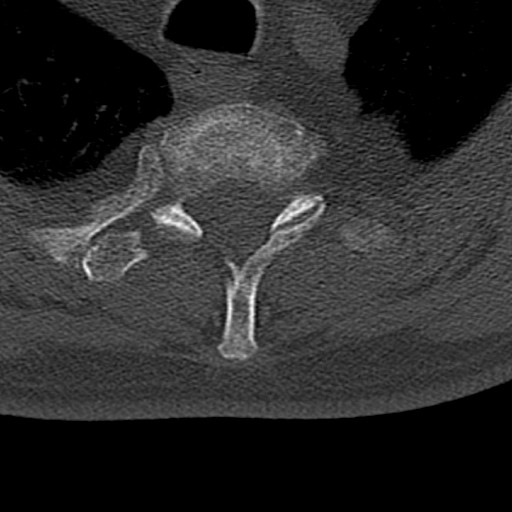
[im 24/93  bone]
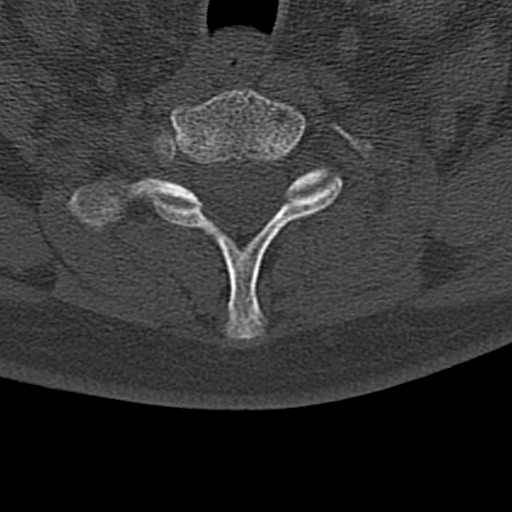
[im 35/93  bone]
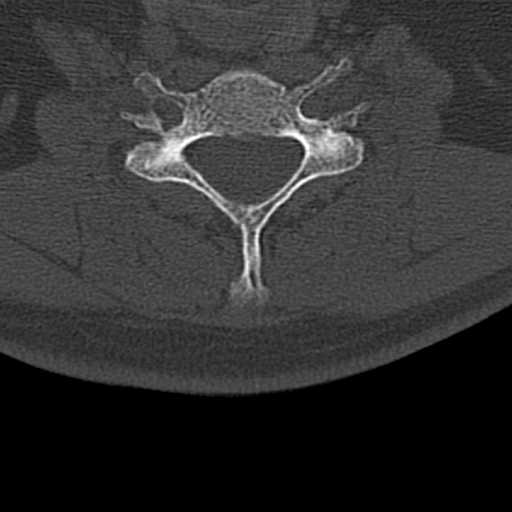
[im 47/93  bone]
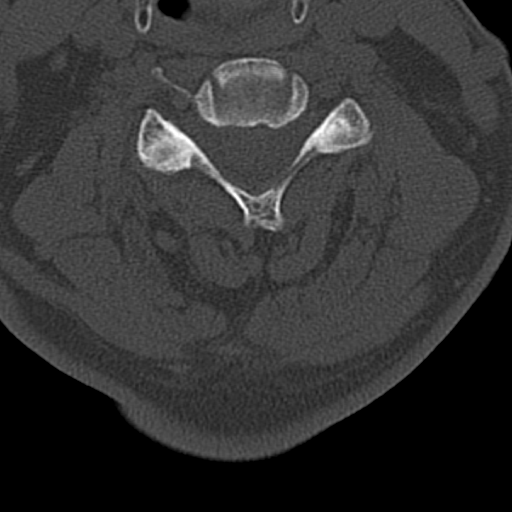
[im 58/93  bone]
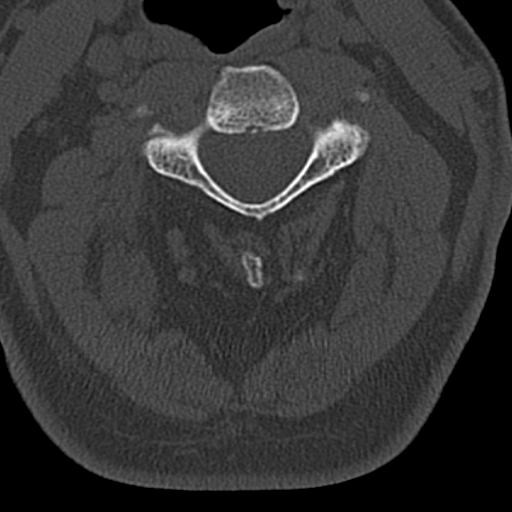
[im 70/93  bone]
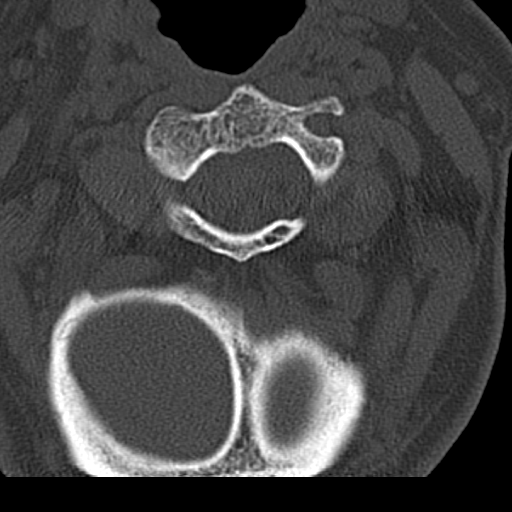
[im 81/93  bone]
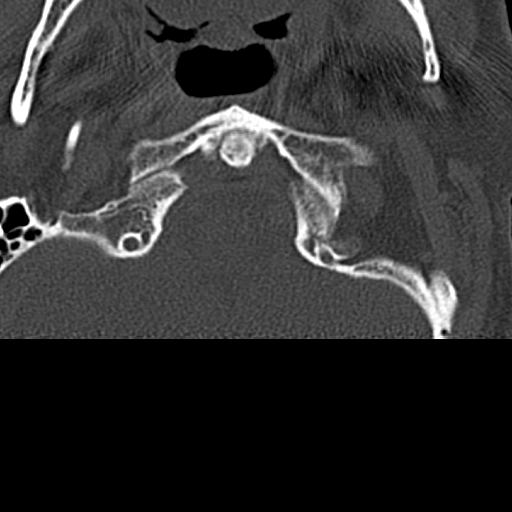

[16 of 47 positions shown; findings below may reference images not displayed]

FINDINGS: CT HEAD FINDINGS

Brain: No evidence of acute infarction, hemorrhage, hydrocephalus,
extra-axial collection or mass lesion/mass effect.

Vascular: No hyperdense vessel or unexpected calcification.

Skull: Normal. Negative for fracture or focal lesion.

Other: None.

CT MAXILLOFACIAL FINDINGS

Osseous: No fracture or mandibular dislocation. No destructive
process.

Orbits: Negative. No traumatic or inflammatory finding.

Sinuses: Mild mucosal thickening is noted in both maxillary sinuses.
No other abnormality is noted.

Soft tissues: Negative.

CT CERVICAL SPINE FINDINGS

Alignment: Normal.

Skull base and vertebrae: No acute fracture. No primary bone lesion
or focal pathologic process.

Soft tissues and spinal canal: No prevertebral fluid or swelling. No
visible canal hematoma.

Disc levels:  Normal.

Upper chest: Negative.

Other: None.
IMPRESSION: Normal head CT.

No significant abnormality seen in the maxillofacial region.

Normal cervical spine.

## 2019-10-29 DEATH — deceased
